# Patient Record
Sex: Male | Born: 1955
Health system: Southern US, Community
[De-identification: ages and names within clinical notes are randomized; demographics above are authoritative.]

## PROBLEM LIST (undated history)

## (undated) DIAGNOSIS — G4730 Sleep apnea, unspecified: Secondary | ICD-10-CM

## (undated) DIAGNOSIS — E039 Hypothyroidism, unspecified: Secondary | ICD-10-CM

## (undated) DIAGNOSIS — R51 Headache: Secondary | ICD-10-CM

## (undated) DIAGNOSIS — E119 Type 2 diabetes mellitus without complications: Secondary | ICD-10-CM

## (undated) DIAGNOSIS — I10 Essential (primary) hypertension: Secondary | ICD-10-CM

## (undated) DIAGNOSIS — R519 Headache, unspecified: Secondary | ICD-10-CM

## (undated) DIAGNOSIS — G8929 Other chronic pain: Secondary | ICD-10-CM

## (undated) DIAGNOSIS — C649 Malignant neoplasm of unspecified kidney, except renal pelvis: Secondary | ICD-10-CM

## (undated) HISTORY — PX: COLONOSCOPY: SHX174

## (undated) HISTORY — DX: Sleep apnea, unspecified: G47.30

## (undated) HISTORY — DX: Malignant neoplasm of unspecified kidney, except renal pelvis: C64.9

## (undated) HISTORY — DX: Essential (primary) hypertension: I10

## (undated) HISTORY — DX: Headache: R51

## (undated) HISTORY — DX: Type 2 diabetes mellitus without complications: E11.9

## (undated) HISTORY — DX: Headache, unspecified: R51.9

## (undated) HISTORY — DX: Other chronic pain: G89.29

## (undated) HISTORY — PX: KNEE SURGERY: SHX244

## (undated) HISTORY — PX: TONSILLECTOMY: SUR1361

## (undated) HISTORY — DX: Hypothyroidism, unspecified: E03.9

---

## 1978-02-15 HISTORY — PX: APPENDECTOMY: SHX54

## 2005-02-09 ENCOUNTER — Ambulatory Visit: Payer: Self-pay | Admitting: Internal Medicine

## 2007-01-27 ENCOUNTER — Ambulatory Visit: Payer: Self-pay | Admitting: Gastroenterology

## 2007-03-01 ENCOUNTER — Ambulatory Visit: Payer: Self-pay | Admitting: Gastroenterology

## 2007-03-22 ENCOUNTER — Ambulatory Visit: Payer: Self-pay | Admitting: Gastroenterology

## 2007-03-22 ENCOUNTER — Encounter: Payer: Self-pay | Admitting: Gastroenterology

## 2007-05-09 ENCOUNTER — Ambulatory Visit: Payer: Self-pay | Admitting: Gastroenterology

## 2007-06-22 ENCOUNTER — Encounter: Payer: Self-pay | Admitting: Gastroenterology

## 2007-06-26 ENCOUNTER — Telehealth: Payer: Self-pay | Admitting: Gastroenterology

## 2008-09-03 ENCOUNTER — Encounter: Payer: Self-pay | Admitting: Gastroenterology

## 2009-02-03 ENCOUNTER — Encounter: Admission: RE | Admit: 2009-02-03 | Discharge: 2009-02-03 | Payer: Self-pay | Admitting: Family Medicine

## 2009-02-17 ENCOUNTER — Encounter: Admission: RE | Admit: 2009-02-17 | Discharge: 2009-02-17 | Payer: Self-pay | Admitting: Family Medicine

## 2009-05-23 ENCOUNTER — Encounter: Admission: RE | Admit: 2009-05-23 | Discharge: 2009-05-23 | Payer: Self-pay | Admitting: Neurology

## 2009-06-04 ENCOUNTER — Encounter: Admission: RE | Admit: 2009-06-04 | Discharge: 2009-06-04 | Payer: Self-pay | Admitting: Neurology

## 2009-06-05 ENCOUNTER — Encounter: Admission: RE | Admit: 2009-06-05 | Discharge: 2009-06-05 | Payer: Self-pay | Admitting: Neurology

## 2010-06-30 NOTE — Letter (Signed)
May 09, 2007    Quita Skye. Artis Flock, M.D.  [address], [address]  [place], [place] [address]   RE:  [name], [name]  MRN:  [id number]  /  DOB:  [birth year]   Dear Rosanne Ashing:   I saw Shawn Mccarty for followup of his endoscopy and colonoscopy, which  were performed on March 22, 2007.   As you recall, he had an endoscopy because of a family history of  Barrett's mucosa in his father and his brother.  The endoscopy did show  some erosive esophagitis, but biopsies showed no evidence of Barrett's  mucosa.  He had been on Aciphex for a month.  He is asymptomatic and the  patient never had typical acid reflux symptoms except when he was  bothered with sleep apnea years ago.  Since being on a CPAP machine, he  has had no symptoms whatsoever.  I suspect he does have some nocturnal  reflux.   His colonoscopy was entirely normal except for a couple of small  hyperplastic rectal nodules.  He has no family history of colon  carcinoma.   Since [name] is asymptomatic, I have asked him to try to discontinue his  Aciphex, but should his symptoms return, he is to call us and we will  start a bedtime dose of PPI therapy.  I have had a  discussion with him about his weight and dietary counseling and an  exercise program.  He says he has had numerous laboratory tests and his  liver function tests are normal.   I will be glad to see him in the future if needed.  I appreciate the  opportunity to participate in the care of this most pleasant patient.    Sincerely,      Vania Rea. Jarold Motto, MD, Caleen Essex, FAGA  Electronically Signed    DRP/MedQ  DD: 05/09/2007  DT: 05/09/2007  Job #: [id number]

## 2011-06-11 ENCOUNTER — Telehealth: Payer: Self-pay | Admitting: *Deleted

## 2013-11-14 ENCOUNTER — Encounter: Payer: Self-pay | Admitting: Gastroenterology

## 2015-10-09 DIAGNOSIS — E119 Type 2 diabetes mellitus without complications: Secondary | ICD-10-CM | POA: Diagnosis not present

## 2015-10-09 DIAGNOSIS — I10 Essential (primary) hypertension: Secondary | ICD-10-CM | POA: Diagnosis not present

## 2015-10-09 DIAGNOSIS — E7800 Pure hypercholesterolemia, unspecified: Secondary | ICD-10-CM | POA: Diagnosis not present

## 2015-10-09 DIAGNOSIS — E039 Hypothyroidism, unspecified: Secondary | ICD-10-CM | POA: Diagnosis not present

## 2015-12-12 DIAGNOSIS — Z205 Contact with and (suspected) exposure to viral hepatitis: Secondary | ICD-10-CM | POA: Diagnosis not present

## 2016-03-31 DIAGNOSIS — E039 Hypothyroidism, unspecified: Secondary | ICD-10-CM | POA: Diagnosis not present

## 2016-03-31 DIAGNOSIS — E7800 Pure hypercholesterolemia, unspecified: Secondary | ICD-10-CM | POA: Diagnosis not present

## 2016-03-31 DIAGNOSIS — I10 Essential (primary) hypertension: Secondary | ICD-10-CM | POA: Diagnosis not present

## 2016-03-31 DIAGNOSIS — E119 Type 2 diabetes mellitus without complications: Secondary | ICD-10-CM | POA: Diagnosis not present

## 2016-10-06 DIAGNOSIS — E7800 Pure hypercholesterolemia, unspecified: Secondary | ICD-10-CM | POA: Diagnosis not present

## 2016-10-06 DIAGNOSIS — E119 Type 2 diabetes mellitus without complications: Secondary | ICD-10-CM | POA: Diagnosis not present

## 2016-10-06 DIAGNOSIS — E039 Hypothyroidism, unspecified: Secondary | ICD-10-CM | POA: Diagnosis not present

## 2016-10-06 DIAGNOSIS — I10 Essential (primary) hypertension: Secondary | ICD-10-CM | POA: Diagnosis not present

## 2016-10-14 DIAGNOSIS — E119 Type 2 diabetes mellitus without complications: Secondary | ICD-10-CM | POA: Diagnosis not present

## 2017-04-13 DIAGNOSIS — I10 Essential (primary) hypertension: Secondary | ICD-10-CM | POA: Diagnosis not present

## 2017-04-13 DIAGNOSIS — E039 Hypothyroidism, unspecified: Secondary | ICD-10-CM | POA: Diagnosis not present

## 2017-04-13 DIAGNOSIS — E7800 Pure hypercholesterolemia, unspecified: Secondary | ICD-10-CM | POA: Diagnosis not present

## 2017-04-13 DIAGNOSIS — E119 Type 2 diabetes mellitus without complications: Secondary | ICD-10-CM | POA: Diagnosis not present

## 2017-04-29 ENCOUNTER — Ambulatory Visit (HOSPITAL_COMMUNITY)
Admission: RE | Admit: 2017-04-29 | Discharge: 2017-04-29 | Disposition: A | Discharge status: Home / Self Care | Payer: BLUE CROSS/BLUE SHIELD | Source: Ambulatory Visit | Attending: Family Medicine | Admitting: Family Medicine

## 2017-04-29 ENCOUNTER — Other Ambulatory Visit: Payer: Self-pay | Admitting: Family Medicine

## 2017-04-29 DIAGNOSIS — N2889 Other specified disorders of kidney and ureter: Secondary | ICD-10-CM | POA: Diagnosis not present

## 2017-04-29 DIAGNOSIS — E278 Other specified disorders of adrenal gland: Secondary | ICD-10-CM | POA: Diagnosis not present

## 2017-04-29 DIAGNOSIS — K760 Fatty (change of) liver, not elsewhere classified: Secondary | ICD-10-CM | POA: Insufficient documentation

## 2017-04-29 DIAGNOSIS — R109 Unspecified abdominal pain: Secondary | ICD-10-CM

## 2017-04-29 DIAGNOSIS — K8020 Calculus of gallbladder without cholecystitis without obstruction: Secondary | ICD-10-CM | POA: Insufficient documentation

## 2017-04-29 LAB — POCT I-STAT CREATININE: CREATININE: 1 mg/dL (ref 0.61–1.24)

## 2017-04-29 MED ORDER — IOPAMIDOL (ISOVUE-300) INJECTION 61%
100.0000 mL | Freq: Once | INTRAVENOUS | Status: AC | PRN
  Administered 2017-04-29: 100 mL via INTRAVENOUS

## 2017-04-29 MED ORDER — IOPAMIDOL (ISOVUE-300) INJECTION 61%
30.0000 mL | Freq: Once | INTRAVENOUS | Status: AC | PRN
  Administered 2017-04-29: 30 mL via ORAL

## 2017-04-29 MED ORDER — IOPAMIDOL (ISOVUE-300) INJECTION 61%
INTRAVENOUS | Status: AC
  Filled 2017-04-29: qty 30

## 2017-04-29 MED ORDER — IOPAMIDOL (ISOVUE-300) INJECTION 61%
INTRAVENOUS | Status: AC
  Filled 2017-04-29: qty 100

## 2017-05-02 ENCOUNTER — Encounter: Payer: Self-pay | Admitting: Gastroenterology

## 2017-05-04 DIAGNOSIS — D49511 Neoplasm of unspecified behavior of right kidney: Secondary | ICD-10-CM | POA: Diagnosis not present

## 2017-05-11 DIAGNOSIS — J984 Other disorders of lung: Secondary | ICD-10-CM | POA: Diagnosis not present

## 2017-05-11 DIAGNOSIS — K8020 Calculus of gallbladder without cholecystitis without obstruction: Secondary | ICD-10-CM | POA: Diagnosis not present

## 2017-05-11 DIAGNOSIS — D3502 Benign neoplasm of left adrenal gland: Secondary | ICD-10-CM | POA: Diagnosis not present

## 2017-05-11 DIAGNOSIS — D49511 Neoplasm of unspecified behavior of right kidney: Secondary | ICD-10-CM | POA: Diagnosis not present

## 2017-05-12 ENCOUNTER — Other Ambulatory Visit: Payer: Self-pay | Admitting: Urology

## 2017-05-12 ENCOUNTER — Ambulatory Visit: Payer: BLUE CROSS/BLUE SHIELD | Admitting: Gastroenterology

## 2017-05-12 ENCOUNTER — Encounter (INDEPENDENT_AMBULATORY_CARE_PROVIDER_SITE_OTHER): Payer: Self-pay

## 2017-05-12 ENCOUNTER — Encounter: Payer: Self-pay | Admitting: Gastroenterology

## 2017-05-12 VITALS — BP 106/64 | HR 88 | Ht 67.0 in | Wt 236.1 lb

## 2017-05-12 DIAGNOSIS — R109 Unspecified abdominal pain: Secondary | ICD-10-CM

## 2017-05-12 DIAGNOSIS — Z1211 Encounter for screening for malignant neoplasm of colon: Secondary | ICD-10-CM | POA: Diagnosis not present

## 2017-05-12 NOTE — Patient Instructions (Addendum)
  Normal BMI (Body Mass Index- based on height and weight) is between 19 and 25. Your BMI today is Body mass index is 36.98 kg/m. Marland Kitchen Please consider follow up  regarding your BMI with your Primary Care Provider.  You have been scheduled for a colonoscopy. Please follow written instructions given to you at your visit today.  Please pick up your prep supplies at the pharmacy within the next 1-3 days. If you use inhalers (even only as needed), please bring them with you on the day of your procedure. Your physician has requested that you go to www.[email or web address].com and enter the access code given to you at your visit today. This web site gives a general overview about your procedure. However, you should still follow specific instructions given to you by our office regarding your preparation for the procedure.  Consider  Miralax  Once or twice daily.

## 2017-05-12 NOTE — Progress Notes (Addendum)
05/12/2017 Shawn Mccarty [id number] Feb 06, 1956   HISTORY OF PRESENT ILLNESS: This is a pleasant 62 year old male who is new to our office.  He was referred here by his PCP, Dr. Marily Memos, for evaluation regarding left flank/back pain.  The patient tells me that about 1 month ago he was walking up a flight of stairs at work and all of a sudden developed pain on his left flank.  It has continued since that time.  It reaches about a 6 out of 10 intensity on the pain scale.  Then several days later he noticed in that same area when he was taking off his clothes and looking in the mirror that he had a large bulge in that area.  That is when he seeked medical attention.  He underwent CT scan of the abdomen and pelvis with contrast on March 15.  That study did not show any cause of his symptoms, but did show an enhancing mass lesion on the right kidney consistent with renal cell carcinoma.  He is following with urology for that and actually had another CT scan yesterday.  They are planning for surgery in early May.  They think they can likely do a partial nephrectomy.  He is also found to have fatty liver and cholelithiasis without complicating factors.  He continues to have pain in that area which is worse with sitting and putting pressure on the area.  He says that if he pushes in a certain area on his back that seems to feel better.  He has tried different muscle relaxers, NSAIDs sporadically, heating pad, etc.  Due to these certain medications he has had some mild constipation and some very light red colored blood on the toilet paper with the straining.  He has tried MiraLAX sporadically, but not using it regularly.  He had a colonoscopy here in January 2009 by Dr. Sharlett Iles at which time the study was normal.   Past Medical History:  Diagnosis Date  . Chronic headaches   . DM (diabetes mellitus) ([place])   . HTN (hypertension)   . Hypothyroidism   . Renal cell carcinoma ([place])   . Sleep apnea with use  of continuous positive airway pressure (CPAP)    Past Surgical History:  Procedure Laterality Date  . APPENDECTOMY  1980  . KNEE SURGERY Left   . TONSILLECTOMY      reports that he quit smoking about 14 years ago. He quit smokeless tobacco use about 29 years ago. His smokeless tobacco use included chew. He reports that he drinks alcohol. He reports that he does not use drugs. family history includes Diabetes in his maternal grandfather; Esophageal cancer (age of onset: 43) in his father; GER disease in his brother; Heart attack in his mother; Heart disease in his maternal grandfather; Pancreatic cancer in his paternal grandmother; Prostate cancer in his maternal grandfather. Allergies  Allergen Reactions  . Other     Combination of antibiotics and steroids Causes weakness in tendons and muscles      Outpatient Encounter Medications as of 05/12/2017  Medication Sig  . levothyroxine (SYNTHROID, LEVOTHROID) 75 MCG tablet Take 112 mcg by mouth daily before breakfast.  . lisinopril (PRINIVIL,ZESTRIL) 5 MG tablet Take 5 mg by mouth daily.  . metFORMIN (GLUCOPHAGE-XR) 500 MG 24 hr tablet Take 1,000 mg by mouth 2 (two) times daily.  . Multiple Vitamin (ONE-A-DAY MENS PO) Take 1 tablet by mouth daily.   No facility-administered encounter medications on file as of 05/12/2017.  REVIEW OF SYSTEMS  : All other systems reviewed and negative except where noted in the History of Present Illness.   PHYSICAL EXAM: BP 106/64 (BP Location: Left Arm, Patient Position: Sitting, Cuff Size: Normal)   Pulse 88   Ht 5\' 7"  (1.702 m) Comment: height measured without shoes  Wt 236 lb 2 oz (107.1 kg)   BMI 36.98 kg/m  General: Well developed white male in no acute distress Head: Normocephalic and atraumatic Eyes:  Sclerae anicteric, conjunctiva pink. Ears: Normal auditory acuity Lungs: Clear throughout to auscultation; no increased WOB. Heart: Regular rate and rhythm; no M/R/G. Abdomen: Soft, obese,  non-distended.  BS present.  Non-tender.  Abdominal wall bulge noted on the left flank. Rectal:  Will be done at the time of colonoscopy. Musculoskeletal: Symmetrical with no gross deformities  Skin: No lesions on visible extremities Extremities: No edema  Neurological: Alert oriented x 4, grossly non-focal Psychological:  Alert and cooperative. Normal mood and affect  ASSESSMENT AND PLAN: *Left flank/back pain:  Pain is positional/worse with movement.  Has a bulge on the left flank area.  Two CT scans negative for cause.  I think that this is likely musculoskeletal.   *Constipation:  Mild since taking some different medications for the flank pain.  Will start Miralax daily. *Screening colonoscopy:  Will schedule with Dr. Carlean Purl.  **The risks, benefits, and alternatives to colonoscopy were discussed with the patient and he consents to proceed.   CC:  Waldemar Dickens, MD  Agree with Ms. Alphia Kava management.  Gatha Mayer, MD, [facility]

## 2017-05-17 ENCOUNTER — Encounter: Payer: Self-pay | Admitting: Internal Medicine

## 2017-05-27 ENCOUNTER — Encounter: Payer: Self-pay | Admitting: Internal Medicine

## 2017-05-27 ENCOUNTER — Ambulatory Visit (AMBULATORY_SURGERY_CENTER): Payer: BLUE CROSS/BLUE SHIELD | Admitting: Internal Medicine

## 2017-05-27 ENCOUNTER — Other Ambulatory Visit: Payer: Self-pay

## 2017-05-27 VITALS — BP 106/68 | HR 78 | Temp 98.4°F | Resp 13 | Ht 67.0 in | Wt 236.0 lb

## 2017-05-27 DIAGNOSIS — K635 Polyp of colon: Secondary | ICD-10-CM

## 2017-05-27 DIAGNOSIS — Z1211 Encounter for screening for malignant neoplasm of colon: Secondary | ICD-10-CM

## 2017-05-27 DIAGNOSIS — D120 Benign neoplasm of cecum: Secondary | ICD-10-CM

## 2017-05-27 MED ORDER — SODIUM CHLORIDE 0.9 % IV SOLN: 500.0000 mL | Freq: Once | INTRAVENOUS | Status: DC

## 2017-05-27 NOTE — Patient Instructions (Addendum)
I found and removed one tiny polyp - not cancer. I will let you know pathology results and when to have another routine colonoscopy by mail and/or My Chart.  You also have a condition called diverticulosis - common and not usually a problem. Please read the handout provided.  I felt the nodule or bulge you have and printed a picture from the CT that may show it.   I appreciate the opportunity to care for you. Gatha Mayer, MD, FACG  YOU HAD AN ENDOSCOPIC PROCEDURE TODAY AT [facility]:   Refer to the procedure report that was given to you for any specific questions about what was found during the examination.  If the procedure report does not answer your questions, please call your gastroenterologist to clarify.  If you requested that your care partner not be given the details of your procedure findings, then the procedure report has been included in a sealed envelope for you to review at your convenience later.  YOU SHOULD EXPECT: Some feelings of bloating in the abdomen. Passage of more gas than usual.  Walking can help get rid of the air that was put into your GI tract during the procedure and reduce the bloating. If you had a lower endoscopy (such as a colonoscopy or flexible sigmoidoscopy) you may notice spotting of blood in your stool or on the toilet paper. If you underwent a bowel prep for your procedure, you may not have a normal bowel movement for a few days.  Please Note:  You might notice some irritation and congestion in your nose or some drainage.  This is from the oxygen used during your procedure.  There is no need for concern and it should clear up in a day or so.  SYMPTOMS TO REPORT IMMEDIATELY:   Following lower endoscopy (colonoscopy or flexible sigmoidoscopy):  Excessive amounts of blood in the stool  Significant tenderness or worsening of abdominal pains  Swelling of the abdomen that is new, acute  Fever of 100F or higher For urgent or  emergent issues, a gastroenterologist can be reached at any hour by calling [phone].   DIET:  We do recommend a small meal at first, but then you may proceed to your regular diet.  Drink plenty of fluids but you should avoid alcoholic beverages for 24 hours.  ACTIVITY:  You should plan to take it easy for the rest of today and you should NOT DRIVE or use heavy machinery until tomorrow (because of the sedation medicines used during the test).    FOLLOW UP: Our staff will call the number listed on your records the next business day following your procedure to check on you and address any questions or concerns that you may have regarding the information given to you following your procedure. If we do not reach you, we will leave a message.  However, if you are feeling well and you are not experiencing any problems, there is no need to return our call.  We will assume that you have returned to your regular daily activities without incident.  If any biopsies were taken you will be contacted by phone or by letter within the next 1-3 weeks.  Please call us at [phone] if you have not heard about the biopsies in 3 weeks.    SIGNATURES/CONFIDENTIALITY: You and/or your care partner have signed paperwork which will be entered into your electronic medical record.  These signatures attest to the fact that that the information  above on your After Visit Summary has been reviewed and is understood.  Full responsibility of the confidentiality of this discharge information lies with you and/or your care-partner. 

## 2017-05-27 NOTE — Progress Notes (Signed)
Called to room to assist during endoscopic procedure.  Patient ID and intended procedure confirmed with present staff. Received instructions for my participation in the procedure from the performing physician.  

## 2017-05-27 NOTE — Op Note (Signed)
[facility] Patient Name: Shawn Mccarty Procedure Date: 05/27/2017 2:05 PM MRN: [id number] Endoscopist: Gatha Mayer , MD Age: 62 Referring MD:  Date of Birth: [birth year] Gender: Male Account #: [id number] Procedure:                Colonoscopy Indications:              Screening for colorectal malignant neoplasm, Last                            colonoscopy: 2009 Medicines:                Propofol per Anesthesia, Monitored Anesthesia Care Procedure:                Pre-Anesthesia Assessment:                           - Prior to the procedure, a History and Physical                            was performed, and patient medications and                            allergies were reviewed. The patient's tolerance of                            previous anesthesia was also reviewed. The risks                            and benefits of the procedure and the sedation                            options and risks were discussed with the patient.                            All questions were answered, and informed consent                            was obtained. Prior Anticoagulants: The patient has                            taken no previous anticoagulant or antiplatelet                            agents. ASA Grade Assessment: III - A patient with                            severe systemic disease. After reviewing the risks                            and benefits, the patient was deemed in                            satisfactory condition to undergo the procedure.  After obtaining informed consent, the colonoscope                            was passed under direct vision. Throughout the                            procedure, the patient's blood pressure, pulse, and                            oxygen saturations were monitored continuously. The                            Model CF-HQ190L [id number]) scope was introduced                            through the anus  and advanced to the the cecum,                            identified by appendiceal orifice and ileocecal                            valve. The colonoscopy was performed without                            difficulty. The patient tolerated the procedure                            well. The quality of the bowel preparation was                            good. The ileocecal valve, appendiceal orifice, and                            rectum were photographed. The bowel preparation                            used was Miralax. Scope In: 2:14:23 PM Scope Out: 2:26:43 PM Scope Withdrawal Time: 0 hours 10 minutes 50 seconds  Total Procedure Duration: 0 hours 12 minutes 20 seconds  Findings:                 The perianal and digital rectal examinations were                            normal. Pertinent negatives include normal prostate                            (size, shape, and consistency).                           A 2 mm polyp was found in the cecum. The polyp was                            sessile. The polyp was removed with a cold biopsy  forceps. Resection and retrieval were complete.                            Verification of patient identification for the                            specimen was done. Estimated blood loss was minimal.                           A few large-mouthed diverticula were found in the                            sigmoid colon.                           The exam was otherwise without abnormality on                            direct and retroflexion views. Complications:            No immediate complications. Estimated Blood Loss:     Estimated blood loss was minimal. Impression:               - One 2 mm polyp in the cecum, removed with a cold                            biopsy forceps. Resected and retrieved.                           - Diverticulosis in the sigmoid colon.                           - The examination was otherwise normal on  direct                            and retroflexion views. Recommendation:           - Patient has a contact number available for                            emergencies. The signs and symptoms of potential                            delayed complications were discussed with the                            patient. Return to normal activities tomorrow.                            Written discharge instructions were provided to the                            patient.                           - Resume previous diet.                           -  Continue present medications.                           - Repeat colonoscopy is recommended. The                            colonoscopy date will be determined after pathology                            results from today's exam become available for                            review. Gatha Mayer, MD 05/27/2017 2:36:02 PM This report has been signed electronically.

## 2017-05-27 NOTE — Progress Notes (Signed)
A and O x3. Report to RN. Tolerated MAC anesthesia well.

## 2017-05-30 ENCOUNTER — Telehealth: Payer: Self-pay | Admitting: *Deleted

## 2017-05-30 NOTE — Telephone Encounter (Signed)
  Follow up Call-  Call back number 05/27/2017  Post procedure Call Back phone  # [phone]  Permission to leave phone message Yes  Some recent data might be hidden     Patient questions:  Do you have a fever, pain , or abdominal swelling? No. Pain Score  0 *  Have you tolerated food without any problems? Yes.    Have you been able to return to your normal activities? Yes.    Do you have any questions about your discharge instructions: Diet   No. Medications  No. Follow up visit  No.  Do you have questions or concerns about your Care? No.  Actions: * If pain score is 4 or above: No action needed, pain <4.

## 2017-06-05 ENCOUNTER — Encounter: Payer: Self-pay | Admitting: Internal Medicine

## 2017-06-05 NOTE — Progress Notes (Signed)
Polyp not pre-cancerous Recall 2029 My Chart

## 2017-06-13 NOTE — Patient Instructions (Signed)
Shawn Mccarty  06/13/2017   Your procedure is scheduled on:  Monday 06/20/2017  Report to [facility] Main  Entrance              Report to admitting at  [place] AM    Call this number if you have problems the morning of surgery [phone]               PLEASE BRING YOUR CPAP MASK AND TUBING WITH YOU TO [facility]!              Day before surgery, drink 1-8 ounce bottle Magnesium Citrate by noon with a clear liquid diet all day up until midnight.             The night before surgery, use 1-Fleet's enema      CLEAR LIQUID DIET   Foods Allowed                                                                     Foods Excluded  Coffee and tea, regular and decaf                             liquids that you cannot  Plain Jell-O in any flavor                                             see through such as: Fruit ices (not with fruit pulp)                                     milk, soups, orange juice  Iced Popsicles                                    All solid food Carbonated beverages, regular and diet                                    Cranberry, grape and apple juices Sports drinks like Gatorade Lightly seasoned clear broth or consume(fat free) Sugar, honey syrup  Sample Menu Breakfast                                Lunch                                     Supper Cranberry juice                    Beef broth                            Chicken  broth Jell-O                                     Grape juice                           Apple juice Coffee or tea                        Jell-O                                      Popsicle                                                Coffee or tea                        Coffee or tea  _____________________________________________________________________  How to Manage Your Diabetes Before and After Surgery  Why is it important to control my blood sugar before and after surgery? . Improving  blood sugar levels before and after surgery helps healing and can limit problems. . A way of improving blood sugar control is eating a healthy diet by: o  Eating less sugar and carbohydrates o  Increasing activity/exercise o  Talking with your doctor about reaching your blood sugar goals . High blood sugars (greater than 180 mg/dL) can raise your risk of infections and slow your recovery, so you will need to focus on controlling your diabetes during the weeks before surgery. . Make sure that the doctor who takes care of your diabetes knows about your planned surgery including the date and location.  How do I manage my blood sugar before surgery? . Check your blood sugar at least 4 times a day, starting 2 days before surgery, to make sure that the level is not too high or low. o Check your blood sugar the morning of your surgery when you wake up and every 2 hours until you get to the Short Stay unit. . If your blood sugar is less than 70 mg/dL, you will need to treat for low blood sugar: o Do not take insulin. o Treat a low blood sugar (less than 70 mg/dL) with  cup of clear juice (cranberry or apple), 4 glucose tablets, OR glucose gel. o Recheck blood sugar in 15 minutes after treatment (to make sure it is greater than 70 mg/dL). If your blood sugar is not greater than 70 mg/dL on recheck, call [phone] for further instructions. . Report your blood sugar to the short stay nurse when you get to Short Stay.  . If you are admitted to the hospital after surgery: o Your blood sugar will be checked by the staff and you will probably be given insulin after surgery (instead of oral diabetes medicines) to make sure you have good blood sugar levels. o The goal for blood sugar control after surgery is 80-180 mg/dL.   WHAT DO I DO ABOUT MY DIABETES MEDICATION?       The day before surgery, take your Metformin as usual.  . Do not take oral diabetes medicines (pills) the morning of  surgery.       Remember: Do not eat food or drink liquids :After Midnight.     Take these medicines the morning of surgery with A SIP OF WATER: Levothyroxine (Synthroid)   DO NOT TAKE ANY DIABETIC MEDICATIONS DAY OF YOUR SURGERY                               You may not have any metal on your body including hair pins and              piercings  Do not wear jewelry, make-up, lotions, powders or perfumes, deodorant             Do not wear nail polish.  Do not shave  48 hours prior to surgery.              Men may shave face and neck.   Do not bring valuables to the hospital. [place].  Contacts, dentures or bridgework may not be worn into surgery.  Leave suitcase in the car. After surgery it may be brought to your room.                  Please read over the following fact sheets you were given: _____________________________________________________________________             [facility] - Preparing for Surgery Before surgery, you can play an important role.  Because skin is not sterile, your skin needs to be as free of germs as possible.  You can reduce the number of germs on your skin by washing with CHG (chlorahexidine gluconate) soap before surgery.  CHG is an antiseptic cleaner which kills germs and bonds with the skin to continue killing germs even after washing. Please DO NOT use if you have an allergy to CHG or antibacterial soaps.  If your skin becomes reddened/irritated stop using the CHG and inform your nurse when you arrive at Short Stay. Do not shave (including legs and underarms) for at least 48 hours prior to the first CHG shower.  You may shave your face/neck. Please follow these instructions carefully:  1.  Shower with CHG Soap the night before surgery and the  morning of Surgery.  2.  If you choose to wash your hair, wash your hair first as usual with your  normal  shampoo.  3.  After you shampoo, rinse your hair and body  thoroughly to remove the  shampoo.                           4.  Use CHG as you would any other liquid soap.  You can apply chg directly  to the skin and wash                       Gently with a scrungie or clean washcloth.  5.  Apply the CHG Soap to your body ONLY FROM THE NECK DOWN.   Do not use on face/ open                           Wound or open sores. Avoid contact with eyes, ears mouth and genitals (private parts).  Wash face,  Genitals (private parts) with your normal soap.             6.  Wash thoroughly, paying special attention to the area where your surgery  will be performed.  7.  Thoroughly rinse your body with warm water from the neck down.  8.  DO NOT shower/wash with your normal soap after using and rinsing off  the CHG Soap.                9.  Pat yourself dry with a clean towel.            10.  Wear clean pajamas.            11.  Place clean sheets on your bed the night of your first shower and do not  sleep with pets. Day of Surgery : Do not apply any lotions/deodorants the morning of surgery.  Please wear clean clothes to the hospital/surgery center.  FAILURE TO FOLLOW THESE INSTRUCTIONS MAY RESULT IN THE CANCELLATION OF YOUR SURGERY PATIENT SIGNATURE_________________________________  NURSE SIGNATURE__________________________________  ________________________________________________________________________   Adam Phenix  An incentive spirometer is a tool that can help keep your lungs clear and active. This tool measures how well you are filling your lungs with each breath. Taking long deep breaths may help reverse or decrease the chance of developing breathing (pulmonary) problems (especially infection) following:  A long period of time when you are unable to move or be active. BEFORE THE PROCEDURE   If the spirometer includes an indicator to show your best effort, your nurse or respiratory therapist will set it to a desired goal.  If  possible, sit up straight or lean slightly forward. Try not to slouch.  Hold the incentive spirometer in an upright position. INSTRUCTIONS FOR USE  1. Sit on the edge of your bed if possible, or sit up as far as you can in bed or on a chair. 2. Hold the incentive spirometer in an upright position. 3. Breathe out normally. 4. Place the mouthpiece in your mouth and seal your lips tightly around it. 5. Breathe in slowly and as deeply as possible, raising the piston or the ball toward the top of the column. 6. Hold your breath for 3-5 seconds or for as long as possible. Allow the piston or ball to fall to the bottom of the column. 7. Remove the mouthpiece from your mouth and breathe out normally. 8. Rest for a few seconds and repeat Steps 1 through 7 at least 10 times every 1-2 hours when you are awake. Take your time and take a few normal breaths between deep breaths. 9. The spirometer may include an indicator to show your best effort. Use the indicator as a goal to work toward during each repetition. 10. After each set of 10 deep breaths, practice coughing to be sure your lungs are clear. If you have an incision (the cut made at the time of surgery), support your incision when coughing by placing a pillow or rolled up towels firmly against it. Once you are able to get out of bed, walk around indoors and cough well. You may stop using the incentive spirometer when instructed by your caregiver.  RISKS AND COMPLICATIONS  Take your time so you do not get dizzy or light-headed.  If you are in pain, you may need to take or ask for pain medication before doing incentive spirometry. It is harder to take a deep breath if you are having pain.  AFTER USE  Rest and breathe slowly and easily.  It can be helpful to keep track of a log of your progress. Your caregiver can provide you with a simple table to help with this. If you are using the spirometer at home, follow these instructions: [place]  IF:   You are having difficultly using the spirometer.  You have trouble using the spirometer as often as instructed.  Your pain medication is not giving enough relief while using the spirometer.  You develop fever of 100.5 F (38.1 C) or higher. SEEK IMMEDIATE MEDICAL CARE IF:   You cough up bloody sputum that had not been present before.  You develop fever of 102 F (38.9 C) or greater.  You develop worsening pain at or near the incision site. MAKE SURE YOU:   Understand these instructions.  Will watch your condition.  Will get help right away if you are not doing well or get worse. Document Released: 06/14/2006 Document Revised: 04/26/2011 Document Reviewed: 08/15/2006 Exit[employer] Patient Information 2014 Exit[employer], [place].   ________________________________________________________________________  WHAT IS A BLOOD TRANSFUSION? Blood Transfusion Information  A transfusion is the replacement of blood or some of its parts. Blood is made up of multiple cells which provide different functions.  Red blood cells carry oxygen and are used for blood loss replacement.  White blood cells fight against infection.  Platelets control bleeding.  Plasma helps clot blood.  Other blood products are available for specialized needs, such as hemophilia or other clotting disorders. BEFORE THE TRANSFUSION  Who gives blood for transfusions?   Healthy volunteers who are fully evaluated to make sure their blood is safe. This is blood bank blood. Transfusion therapy is the safest it has ever been in the practice of medicine. Before blood is taken from a donor, a complete history is taken to make sure that person has no history of diseases nor engages in risky social behavior (examples are intravenous drug use or sexual activity with multiple partners). The donor's travel history is screened to minimize risk of transmitting infections, such as malaria. The donated blood is tested for signs of  infectious diseases, such as HIV and hepatitis. The blood is then tested to be sure it is compatible with you in order to minimize the chance of a transfusion reaction. If you or a relative donates blood, this is often done in anticipation of surgery and is not appropriate for emergency situations. It takes many days to process the donated blood. RISKS AND COMPLICATIONS Although transfusion therapy is very safe and saves many lives, the main dangers of transfusion include:   Getting an infectious disease.  Developing a transfusion reaction. This is an allergic reaction to something in the blood you were given. Every precaution is taken to prevent this. The decision to have a blood transfusion has been considered carefully by your caregiver before blood is given. Blood is not given unless the benefits outweigh the risks. AFTER THE TRANSFUSION  Right after receiving a blood transfusion, you will usually feel much better and more energetic. This is especially true if your red blood cells have gotten low (anemic). The transfusion raises the level of the red blood cells which carry oxygen, and this usually causes an energy increase.  The nurse administering the transfusion will monitor you carefully for complications. HOME CARE INSTRUCTIONS  No special instructions are needed after a transfusion. You may find your energy is better. Speak with your caregiver about any limitations on activity for underlying diseases  you may have. SEEK MEDICAL CARE IF:   Your condition is not improving after your transfusion.  You develop redness or irritation at the intravenous (IV) site. SEEK IMMEDIATE MEDICAL CARE IF:  Any of the following symptoms occur over the next 12 hours:  Shaking chills.  You have a temperature by mouth above 102 F (38.9 C), not controlled by medicine.  Chest, back, or muscle pain.  People around you feel you are not acting correctly or are confused.  Shortness of breath or  difficulty breathing.  Dizziness and fainting.  You get a rash or develop hives.  You have a decrease in urine output.  Your urine turns a dark color or changes to pink, red, or brown. Any of the following symptoms occur over the next 10 days:  You have a temperature by mouth above 102 F (38.9 C), not controlled by medicine.  Shortness of breath.  Weakness after normal activity.  The white part of the eye turns yellow (jaundice).  You have a decrease in the amount of urine or are urinating less often.  Your urine turns a dark color or changes to pink, red, or brown. Document Released: 01/30/2000 Document Revised: 04/26/2011 Document Reviewed: 09/18/2007 [facility] Patient Information 2014 [place], [place].  _______________________________________________________________________

## 2017-06-14 ENCOUNTER — Encounter (HOSPITAL_COMMUNITY): Payer: Self-pay

## 2017-06-14 ENCOUNTER — Other Ambulatory Visit: Payer: Self-pay

## 2017-06-14 ENCOUNTER — Encounter (HOSPITAL_COMMUNITY)
Admission: RE | Admit: 2017-06-14 | Discharge: 2017-06-14 | Disposition: A | Discharge status: Home / Self Care | Payer: BLUE CROSS/BLUE SHIELD | Source: Ambulatory Visit | Attending: Urology | Admitting: Urology

## 2017-06-14 DIAGNOSIS — I10 Essential (primary) hypertension: Secondary | ICD-10-CM | POA: Diagnosis not present

## 2017-06-14 DIAGNOSIS — D49511 Neoplasm of unspecified behavior of right kidney: Secondary | ICD-10-CM | POA: Diagnosis not present

## 2017-06-14 DIAGNOSIS — Z01818 Encounter for other preprocedural examination: Secondary | ICD-10-CM | POA: Diagnosis not present

## 2017-06-14 LAB — BASIC METABOLIC PANEL
ANION GAP: 9 (ref 5–15)
BUN: 20 mg/dL (ref 6–20)
CALCIUM: 9.3 mg/dL (ref 8.9–10.3)
CO2: 24 mmol/L (ref 22–32)
CREATININE: 1.27 mg/dL — AB (ref 0.61–1.24)
Chloride: 109 mmol/L (ref 101–111)
GFR, EST NON AFRICAN AMERICAN: 59 mL/min — AB (ref >60)
GLUCOSE: 108 mg/dL — AB (ref 65–99)
Potassium: 4.1 mmol/L (ref 3.5–5.1)
Sodium: 142 mmol/L (ref 135–145)

## 2017-06-14 LAB — CBC
HCT: 42.2 % (ref 39.0–52.0)
HEMOGLOBIN: 14.2 g/dL (ref 13.0–17.0)
MCH: 27 pg (ref 26.0–34.0)
MCHC: 33.6 g/dL (ref 30.0–36.0)
MCV: 80.2 fL (ref 78.0–100.0)
PLATELETS: 220 10*3/uL (ref 150–400)
RBC: 5.26 MIL/uL (ref 4.22–5.81)
RDW: 14.2 % (ref 11.5–15.5)
WBC: 7.5 10*3/uL (ref 4.0–10.5)

## 2017-06-14 LAB — HEMOGLOBIN A1C
HEMOGLOBIN A1C: 5.8 % — AB (ref 4.8–5.6)
MEAN PLASMA GLUCOSE: 119.76 mg/dL

## 2017-06-14 LAB — GLUCOSE, CAPILLARY: GLUCOSE-CAPILLARY: 112 mg/dL — AB (ref 65–99)

## 2017-06-15 LAB — ABO/RH: ABO/RH(D): A POS

## 2017-06-15 NOTE — Pre-Procedure Instructions (Signed)
Hgb A1C results 06/14/2017 faxed to Dr. Alinda Money via epic.

## 2017-06-20 ENCOUNTER — Observation Stay (HOSPITAL_COMMUNITY)
Admission: RE | Admit: 2017-06-20 | Discharge: 2017-06-22 | Disposition: A | Discharge status: Home / Self Care | Payer: BLUE CROSS/BLUE SHIELD | Source: Ambulatory Visit | Attending: Urology | Admitting: Urology

## 2017-06-20 ENCOUNTER — Ambulatory Visit (HOSPITAL_COMMUNITY): Payer: BLUE CROSS/BLUE SHIELD | Admitting: Anesthesiology

## 2017-06-20 ENCOUNTER — Encounter (HOSPITAL_COMMUNITY)
Admission: RE | Disposition: A | Discharge status: Home / Self Care | Payer: Self-pay | Source: Ambulatory Visit | Attending: Urology

## 2017-06-20 ENCOUNTER — Other Ambulatory Visit: Payer: Self-pay

## 2017-06-20 ENCOUNTER — Encounter (HOSPITAL_COMMUNITY): Payer: Self-pay | Admitting: Emergency Medicine

## 2017-06-20 DIAGNOSIS — Z7984 Long term (current) use of oral hypoglycemic drugs: Secondary | ICD-10-CM | POA: Diagnosis not present

## 2017-06-20 DIAGNOSIS — G4730 Sleep apnea, unspecified: Secondary | ICD-10-CM | POA: Insufficient documentation

## 2017-06-20 DIAGNOSIS — E119 Type 2 diabetes mellitus without complications: Secondary | ICD-10-CM | POA: Insufficient documentation

## 2017-06-20 DIAGNOSIS — Z791 Long term (current) use of non-steroidal anti-inflammatories (NSAID): Secondary | ICD-10-CM | POA: Diagnosis not present

## 2017-06-20 DIAGNOSIS — Z79899 Other long term (current) drug therapy: Secondary | ICD-10-CM | POA: Insufficient documentation

## 2017-06-20 DIAGNOSIS — Z6836 Body mass index (BMI) 36.0-36.9, adult: Secondary | ICD-10-CM | POA: Diagnosis not present

## 2017-06-20 DIAGNOSIS — Z79890 Hormone replacement therapy (postmenopausal): Secondary | ICD-10-CM | POA: Insufficient documentation

## 2017-06-20 DIAGNOSIS — D49519 Neoplasm of unspecified behavior of unspecified kidney: Secondary | ICD-10-CM | POA: Diagnosis present

## 2017-06-20 DIAGNOSIS — E669 Obesity, unspecified: Secondary | ICD-10-CM | POA: Diagnosis not present

## 2017-06-20 DIAGNOSIS — I10 Essential (primary) hypertension: Secondary | ICD-10-CM | POA: Diagnosis not present

## 2017-06-20 DIAGNOSIS — Z23 Encounter for immunization: Secondary | ICD-10-CM | POA: Insufficient documentation

## 2017-06-20 DIAGNOSIS — M1990 Unspecified osteoarthritis, unspecified site: Secondary | ICD-10-CM | POA: Diagnosis not present

## 2017-06-20 DIAGNOSIS — E039 Hypothyroidism, unspecified: Secondary | ICD-10-CM | POA: Insufficient documentation

## 2017-06-20 DIAGNOSIS — Z9989 Dependence on other enabling machines and devices: Secondary | ICD-10-CM | POA: Diagnosis not present

## 2017-06-20 DIAGNOSIS — C641 Malignant neoplasm of right kidney, except renal pelvis: Secondary | ICD-10-CM | POA: Diagnosis not present

## 2017-06-20 DIAGNOSIS — Z87891 Personal history of nicotine dependence: Secondary | ICD-10-CM | POA: Insufficient documentation

## 2017-06-20 DIAGNOSIS — D49511 Neoplasm of unspecified behavior of right kidney: Secondary | ICD-10-CM | POA: Diagnosis not present

## 2017-06-20 HISTORY — PX: ROBOT ASSISTED LAPAROSCOPIC NEPHRECTOMY: SHX5140

## 2017-06-20 LAB — BASIC METABOLIC PANEL
Anion gap: 9 (ref 5–15)
BUN: 12 mg/dL (ref 6–20)
CHLORIDE: 106 mmol/L (ref 101–111)
CO2: 26 mmol/L (ref 22–32)
Calcium: 8.9 mg/dL (ref 8.9–10.3)
Creatinine, Ser: 1.28 mg/dL — ABNORMAL HIGH (ref 0.61–1.24)
GFR calc Af Amer: >60 mL/min (ref >60)
GFR calc non Af Amer: 59 mL/min — ABNORMAL LOW (ref >60)
Glucose, Bld: 167 mg/dL — ABNORMAL HIGH (ref 65–99)
Potassium: 4.3 mmol/L (ref 3.5–5.1)
SODIUM: 141 mmol/L (ref 135–145)

## 2017-06-20 LAB — TYPE AND SCREEN
ABO/RH(D): A POS
Antibody Screen: NEGATIVE

## 2017-06-20 LAB — HEMOGLOBIN AND HEMATOCRIT, BLOOD
HCT: 41.2 % (ref 39.0–52.0)
HEMOGLOBIN: 13.9 g/dL (ref 13.0–17.0)

## 2017-06-20 LAB — GLUCOSE, CAPILLARY
GLUCOSE-CAPILLARY: 139 mg/dL — AB (ref 65–99)
Glucose-Capillary: 127 mg/dL — ABNORMAL HIGH (ref 65–99)
Glucose-Capillary: 172 mg/dL — ABNORMAL HIGH (ref 65–99)

## 2017-06-20 SURGERY — NEPHRECTOMY, RADICAL, ROBOT-ASSISTED, LAPAROSCOPIC, ADULT
Anesthesia: General | Laterality: Right

## 2017-06-20 MED ORDER — SUGAMMADEX SODIUM 500 MG/5ML IV SOLN
INTRAVENOUS | Status: AC
  Filled 2017-06-20: qty 5

## 2017-06-20 MED ORDER — CEFAZOLIN SODIUM-DEXTROSE 1-4 GM/50ML-% IV SOLN
1.0000 g | Freq: Three times a day (TID) | INTRAVENOUS | Status: AC
  Administered 2017-06-20 – 2017-06-21 (×2): 1 g via INTRAVENOUS
  Filled 2017-06-20 (×2): qty 50

## 2017-06-20 MED ORDER — SCOPOLAMINE 1 MG/3DAYS TD PT72
1.0000 | MEDICATED_PATCH | Freq: Once | TRANSDERMAL | Status: AC
  Administered 2017-06-20: 1 via TRANSDERMAL
  Filled 2017-06-20: qty 1

## 2017-06-20 MED ORDER — SODIUM CHLORIDE 0.9 % IJ SOLN
INTRAMUSCULAR | Status: DC | PRN
  Administered 2017-06-20: 20 mL

## 2017-06-20 MED ORDER — ROCURONIUM BROMIDE 100 MG/10ML IV SOLN
INTRAVENOUS | Status: DC | PRN
  Administered 2017-06-20: 60 mg via INTRAVENOUS
  Administered 2017-06-20 (×2): 10 mg via INTRAVENOUS
  Administered 2017-06-20: 20 mg via INTRAVENOUS
  Administered 2017-06-20 (×3): 10 mg via INTRAVENOUS

## 2017-06-20 MED ORDER — LACTATED RINGERS IR SOLN
Status: DC | PRN
  Administered 2017-06-20: 1000 mL

## 2017-06-20 MED ORDER — LIDOCAINE 2% (20 MG/ML) 5 ML SYRINGE
INTRAMUSCULAR | Status: AC
  Filled 2017-06-20: qty 5

## 2017-06-20 MED ORDER — ONDANSETRON HCL 4 MG/2ML IJ SOLN
INTRAMUSCULAR | Status: DC | PRN
  Administered 2017-06-20: 4 mg via INTRAVENOUS

## 2017-06-20 MED ORDER — HEMOSTATIC AGENTS (NO CHARGE) OPTIME
TOPICAL | Status: DC | PRN
  Administered 2017-06-20: 1 via TOPICAL

## 2017-06-20 MED ORDER — KETAMINE HCL 10 MG/ML IJ SOLN
INTRAMUSCULAR | Status: DC | PRN
  Administered 2017-06-20: 50 mg via INTRAVENOUS

## 2017-06-20 MED ORDER — FENTANYL CITRATE (PF) 250 MCG/5ML IJ SOLN
INTRAMUSCULAR | Status: AC
  Filled 2017-06-20: qty 5

## 2017-06-20 MED ORDER — INSULIN ASPART 100 UNIT/ML ~~LOC~~ SOLN
0.0000 [IU] | SUBCUTANEOUS | Status: DC
  Administered 2017-06-20: 3 [IU] via SUBCUTANEOUS
  Administered 2017-06-21 (×4): 2 [IU] via SUBCUTANEOUS
  Administered 2017-06-21: 3 [IU] via SUBCUTANEOUS
  Administered 2017-06-21 – 2017-06-22 (×2): 2 [IU] via SUBCUTANEOUS
  Administered 2017-06-22: 3 [IU] via SUBCUTANEOUS
  Administered 2017-06-22: 2 [IU] via SUBCUTANEOUS

## 2017-06-20 MED ORDER — BUPIVACAINE LIPOSOME 1.3 % IJ SUSP
20.0000 mL | Freq: Once | INTRAMUSCULAR | Status: AC
  Administered 2017-06-20: 20 mL
  Filled 2017-06-20: qty 20

## 2017-06-20 MED ORDER — KETAMINE HCL 10 MG/ML IJ SOLN
INTRAMUSCULAR | Status: AC
  Filled 2017-06-20: qty 1

## 2017-06-20 MED ORDER — DOCUSATE SODIUM 100 MG PO CAPS
100.0000 mg | ORAL_CAPSULE | Freq: Two times a day (BID) | ORAL | Status: DC
  Administered 2017-06-20 – 2017-06-22 (×4): 100 mg via ORAL
  Filled 2017-06-20 (×4): qty 1

## 2017-06-20 MED ORDER — SODIUM CHLORIDE 0.9 % IV SOLN
INTRAVENOUS | Status: DC
  Administered 2017-06-20 – 2017-06-21 (×2): via INTRAVENOUS

## 2017-06-20 MED ORDER — DIPHENHYDRAMINE HCL 12.5 MG/5ML PO ELIX: 12.5000 mg | ORAL_SOLUTION | Freq: Four times a day (QID) | ORAL | Status: DC | PRN

## 2017-06-20 MED ORDER — HYDROMORPHONE HCL 2 MG/ML IJ SOLN
INTRAMUSCULAR | Status: AC
  Filled 2017-06-20: qty 1

## 2017-06-20 MED ORDER — MIDAZOLAM HCL 2 MG/2ML IJ SOLN
INTRAMUSCULAR | Status: AC
  Filled 2017-06-20: qty 2

## 2017-06-20 MED ORDER — LIDOCAINE HCL 2 % IJ SOLN
INTRAMUSCULAR | Status: AC
  Filled 2017-06-20: qty 20

## 2017-06-20 MED ORDER — LIDOCAINE 20MG/ML (2%) 15 ML SYRINGE OPTIME
INTRAMUSCULAR | Status: DC | PRN
  Administered 2017-06-20: 1.5 mg/kg/h via INTRAVENOUS

## 2017-06-20 MED ORDER — FLEET ENEMA 7-19 GM/118ML RE ENEM: 1.0000 | ENEMA | Freq: Once | RECTAL | Status: DC

## 2017-06-20 MED ORDER — LACTATED RINGERS IV SOLN
INTRAVENOUS | Status: DC
  Administered 2017-06-20: 09:00:00 via INTRAVENOUS

## 2017-06-20 MED ORDER — FENTANYL CITRATE (PF) 100 MCG/2ML IJ SOLN: 25.0000 ug | INTRAMUSCULAR | Status: DC | PRN

## 2017-06-20 MED ORDER — DIPHENHYDRAMINE HCL 50 MG/ML IJ SOLN: 12.5000 mg | Freq: Four times a day (QID) | INTRAMUSCULAR | Status: DC | PRN

## 2017-06-20 MED ORDER — STERILE WATER FOR IRRIGATION IR SOLN
Status: DC | PRN
  Administered 2017-06-20: 1000 mL

## 2017-06-20 MED ORDER — DEXAMETHASONE SODIUM PHOSPHATE 10 MG/ML IJ SOLN
INTRAMUSCULAR | Status: AC
  Filled 2017-06-20: qty 1

## 2017-06-20 MED ORDER — CEFAZOLIN SODIUM-DEXTROSE 2-4 GM/100ML-% IV SOLN
2.0000 g | INTRAVENOUS | Status: AC
  Administered 2017-06-20: 2 g via INTRAVENOUS
  Filled 2017-06-20: qty 100

## 2017-06-20 MED ORDER — PROMETHAZINE HCL 25 MG/ML IJ SOLN: 6.2500 mg | INTRAMUSCULAR | Status: DC | PRN

## 2017-06-20 MED ORDER — HYDROCODONE-ACETAMINOPHEN 5-325 MG PO TABS: 1.0000 | ORAL_TABLET | Freq: Four times a day (QID) | ORAL | 0 refills | Status: AC | PRN

## 2017-06-20 MED ORDER — LIDOCAINE HCL (CARDIAC) PF 100 MG/5ML IV SOSY
PREFILLED_SYRINGE | INTRAVENOUS | Status: DC | PRN
  Administered 2017-06-20: 100 mg via INTRAVENOUS

## 2017-06-20 MED ORDER — MORPHINE SULFATE (PF) 4 MG/ML IV SOLN
2.0000 mg | INTRAVENOUS | Status: DC | PRN
  Administered 2017-06-20 – 2017-06-21 (×2): 2 mg via INTRAVENOUS
  Filled 2017-06-20 (×2): qty 1

## 2017-06-20 MED ORDER — ONDANSETRON HCL 4 MG/2ML IJ SOLN
4.0000 mg | INTRAMUSCULAR | Status: DC | PRN
  Administered 2017-06-21 – 2017-06-22 (×3): 4 mg via INTRAVENOUS
  Filled 2017-06-20 (×3): qty 2

## 2017-06-20 MED ORDER — LEVOTHYROXINE SODIUM 25 MCG PO TABS
93.7500 ug | ORAL_TABLET | Freq: Every day | ORAL | Status: DC
  Administered 2017-06-21 – 2017-06-22 (×2): 93.5 ug via ORAL
  Filled 2017-06-20 (×3): qty 1

## 2017-06-20 MED ORDER — SUGAMMADEX SODIUM 500 MG/5ML IV SOLN
INTRAVENOUS | Status: DC | PRN
  Administered 2017-06-20: 250 mg via INTRAVENOUS

## 2017-06-20 MED ORDER — FENTANYL CITRATE (PF) 100 MCG/2ML IJ SOLN
INTRAMUSCULAR | Status: DC | PRN
  Administered 2017-06-20 (×2): 50 ug via INTRAVENOUS
  Administered 2017-06-20: 100 ug via INTRAVENOUS
  Administered 2017-06-20: 50 ug via INTRAVENOUS

## 2017-06-20 MED ORDER — DEXAMETHASONE SODIUM PHOSPHATE 10 MG/ML IJ SOLN
INTRAMUSCULAR | Status: DC | PRN
  Administered 2017-06-20: 10 mg via INTRAVENOUS

## 2017-06-20 MED ORDER — PROPOFOL 10 MG/ML IV BOLUS
INTRAVENOUS | Status: DC | PRN
  Administered 2017-06-20: 20 mg via INTRAVENOUS
  Administered 2017-06-20: 150 mg via INTRAVENOUS

## 2017-06-20 MED ORDER — ORAL CARE MOUTH RINSE
15.0000 mL | Freq: Two times a day (BID) | OROMUCOSAL | Status: DC
  Administered 2017-06-20 – 2017-06-21 (×3): 15 mL via OROMUCOSAL

## 2017-06-20 MED ORDER — SUGAMMADEX SODIUM 200 MG/2ML IV SOLN
INTRAVENOUS | Status: AC
  Filled 2017-06-20: qty 2

## 2017-06-20 MED ORDER — MAGNESIUM CITRATE PO SOLN: 1.0000 | Freq: Once | ORAL | Status: DC

## 2017-06-20 MED ORDER — ONDANSETRON HCL 4 MG/2ML IJ SOLN
INTRAMUSCULAR | Status: AC
  Filled 2017-06-20: qty 2

## 2017-06-20 MED ORDER — ROCURONIUM BROMIDE 10 MG/ML (PF) SYRINGE
PREFILLED_SYRINGE | INTRAVENOUS | Status: AC
  Filled 2017-06-20: qty 5

## 2017-06-20 MED ORDER — SODIUM CHLORIDE 0.9 % IJ SOLN
INTRAMUSCULAR | Status: AC
  Filled 2017-06-20: qty 20

## 2017-06-20 MED ORDER — SODIUM CHLORIDE 0.9 % IJ SOLN
INTRAMUSCULAR | Status: AC
  Filled 2017-06-20: qty 10

## 2017-06-20 MED ORDER — HYDROMORPHONE HCL 1 MG/ML IJ SOLN
INTRAMUSCULAR | Status: DC | PRN
  Administered 2017-06-20 (×2): 0.5 mg via INTRAVENOUS
  Administered 2017-06-20: 1 mg via INTRAVENOUS

## 2017-06-20 MED ORDER — BUPIVACAINE-EPINEPHRINE (PF) 0.5% -1:200000 IJ SOLN
INTRAMUSCULAR | Status: AC
  Filled 2017-06-20: qty 30

## 2017-06-20 MED ORDER — LACTATED RINGERS IV SOLN
INTRAVENOUS | Status: DC | PRN
  Administered 2017-06-20 (×2): via INTRAVENOUS

## 2017-06-20 MED ORDER — BUPIVACAINE HCL (PF) 0.25 % IJ SOLN
INTRAMUSCULAR | Status: AC
  Filled 2017-06-20: qty 30

## 2017-06-20 MED ORDER — PROPOFOL 10 MG/ML IV BOLUS
INTRAVENOUS | Status: AC
  Filled 2017-06-20: qty 20

## 2017-06-20 MED ORDER — PNEUMOCOCCAL VAC POLYVALENT 25 MCG/0.5ML IJ INJ
0.5000 mL | INJECTION | INTRAMUSCULAR | Status: AC
  Administered 2017-06-22: 0.5 mL via INTRAMUSCULAR
  Filled 2017-06-20: qty 0.5

## 2017-06-20 MED ORDER — LORATADINE 10 MG PO TABS
10.0000 mg | ORAL_TABLET | Freq: Every day | ORAL | Status: DC
  Administered 2017-06-21 – 2017-06-22 (×2): 10 mg via ORAL
  Filled 2017-06-20 (×2): qty 1

## 2017-06-20 SURGICAL SUPPLY — 52 items
APPLICATOR SURGIFLO ENDO (HEMOSTASIS) ×3 IMPLANT
CHLORAPREP W/TINT 26ML (MISCELLANEOUS) ×3 IMPLANT
CLIP VESOLOCK LG 6/CT PURPLE (CLIP) ×3 IMPLANT
CLIP VESOLOCK MED LG 6/CT (CLIP) ×6 IMPLANT
COVER SURGICAL LIGHT HANDLE (MISCELLANEOUS) ×3 IMPLANT
COVER TIP SHEARS 8 DVNC (MISCELLANEOUS) ×1 IMPLANT
COVER TIP SHEARS 8MM DA VINCI (MISCELLANEOUS) ×2
DECANTER SPIKE VIAL GLASS SM (MISCELLANEOUS) ×3 IMPLANT
DERMABOND ADVANCED (GAUZE/BANDAGES/DRESSINGS) ×2
DERMABOND ADVANCED .7 DNX12 (GAUZE/BANDAGES/DRESSINGS) ×1 IMPLANT
DRAIN CHANNEL 15F RND FF 3/16 (WOUND CARE) IMPLANT
DRAPE ARM DVNC X/XI (DISPOSABLE) ×4 IMPLANT
DRAPE COLUMN DVNC XI (DISPOSABLE) ×1 IMPLANT
DRAPE DA VINCI XI ARM (DISPOSABLE) ×8
DRAPE DA VINCI XI COLUMN (DISPOSABLE) ×2
DRAPE INCISE IOBAN 66X45 STRL (DRAPES) ×3 IMPLANT
DRAPE SHEET LG 3/4 BI-LAMINATE (DRAPES) ×3 IMPLANT
ELECT PENCIL ROCKER SW 15FT (MISCELLANEOUS) ×3 IMPLANT
ELECT REM PT RETURN 15FT ADLT (MISCELLANEOUS) ×3 IMPLANT
EVACUATOR SILICONE 100CC (DRAIN) ×3 IMPLANT
GLOVE BIO SURGEON STRL SZ 6.5 (GLOVE) ×2 IMPLANT
GLOVE BIO SURGEONS STRL SZ 6.5 (GLOVE) ×1
GLOVE BIOGEL M STRL SZ7.5 (GLOVE) ×6 IMPLANT
GOWN STRL REUS W/TWL LRG LVL3 (GOWN DISPOSABLE) ×9 IMPLANT
IRRIG SUCT STRYKERFLOW 2 WTIP (MISCELLANEOUS)
IRRIGATION SUCT STRKRFLW 2 WTP (MISCELLANEOUS) IMPLANT
KIT BASIN OR (CUSTOM PROCEDURE TRAY) ×3 IMPLANT
MARKER SKIN DUAL TIP RULER LAB (MISCELLANEOUS) ×3 IMPLANT
NS IRRIG 1000ML POUR BTL (IV SOLUTION) IMPLANT
POSITIONER SURGICAL ARM (MISCELLANEOUS) ×3 IMPLANT
POUCH SPECIMEN RETRIEVAL 10MM (ENDOMECHANICALS) ×3 IMPLANT
SEAL CANN UNIV 5-8 DVNC XI (MISCELLANEOUS) ×4 IMPLANT
SEAL XI 5MM-8MM UNIVERSAL (MISCELLANEOUS) ×8
SOLUTION ELECTROLUBE (MISCELLANEOUS) ×3 IMPLANT
SURGIFLO W/THROMBIN 8M KIT (HEMOSTASIS) ×3 IMPLANT
SUT ETHILON 3 0 PS 1 (SUTURE) IMPLANT
SUT MNCRL AB 4-0 PS2 18 (SUTURE) ×6 IMPLANT
SUT PDS AB 0 CTX 36 PDP370T (SUTURE) IMPLANT
SUT V-LOC BARB 180 2/0GR6 GS22 (SUTURE) ×3
SUT VIC AB 0 CT1 27 (SUTURE) ×2
SUT VIC AB 0 CT1 27XBRD ANTBC (SUTURE) ×1 IMPLANT
SUT VICRYL 0 UR6 27IN ABS (SUTURE) ×6 IMPLANT
SUT VLOC BARB 180 ABS3/0GR12 (SUTURE) ×6
SUTURE V-LC BRB 180 2/0GR6GS22 (SUTURE) ×1 IMPLANT
SUTURE VLOC BRB 180 ABS3/0GR12 (SUTURE) ×2 IMPLANT
TOWEL OR 17X26 10 PK STRL BLUE (TOWEL DISPOSABLE) ×3 IMPLANT
TOWEL OR NON WOVEN STRL DISP B (DISPOSABLE) ×3 IMPLANT
TRAY FOLEY W/METER SILVER 16FR (SET/KITS/TRAYS/PACK) ×3 IMPLANT
TRAY LAPAROSCOPIC (CUSTOM PROCEDURE TRAY) ×3 IMPLANT
TROCAR UNIVERSAL OPT 12M 100M (ENDOMECHANICALS) IMPLANT
TROCAR XCEL 12X100 BLDLESS (ENDOMECHANICALS) ×3 IMPLANT
WATER STERILE IRR 1000ML POUR (IV SOLUTION) IMPLANT

## 2017-06-20 NOTE — Transfer of Care (Signed)
Immediate Anesthesia Transfer of Care Note  Patient: Shawn Mccarty  Procedure(s) Performed: XI ROBOTIC ASSISTED LAPAROSCOPIC PARTIAL NEPHRECTOMY (Right )  Patient Location: PACU  Anesthesia Type:General  Level of Consciousness: drowsy, patient cooperative and responds to stimulation  Airway & Oxygen Therapy: Patient Spontanous Breathing and Patient connected to face mask oxygen  Post-op Assessment: Report given to RN and Post -op Vital signs reviewed and stable  Post vital signs: Reviewed and stable  Last Vitals:  Vitals Value Taken Time  BP 143/93 06/20/2017  4:37 PM  Temp    Pulse 95 06/20/2017  4:39 PM  Resp 11 06/20/2017  4:39 PM  SpO2 97 % 06/20/2017  4:39 PM  Vitals shown include unvalidated device data.  Last Pain:  Vitals:   06/20/17 0928  TempSrc:   PainSc: 0-No pain      Patients Stated Pain Goal: 4 (16/10/96 0454)  Complications: No apparent anesthesia complications

## 2017-06-20 NOTE — Anesthesia Postprocedure Evaluation (Signed)
Anesthesia Post Note  Patient: Shawn Mccarty  Procedure(s) Performed: XI ROBOTIC ASSISTED LAPAROSCOPIC PARTIAL NEPHRECTOMY (Right )     Anesthesia Post Evaluation  Last Vitals:  Vitals:   06/20/17 0921 06/20/17 1637  BP: 134/84   Pulse: 77   Resp: 16   Temp: 36.7 C (P) 36.6 C  SpO2: 100%     Last Pain:  Vitals:   06/20/17 1637  TempSrc:   PainSc: (P) Asleep                 Christifer Chapdelaine GLENN

## 2017-06-20 NOTE — Anesthesia Procedure Notes (Signed)
Procedure Name: Intubation Date/Time: 06/20/2017 11:53 AM Performed by: Glory Buff, CRNA Pre-anesthesia Checklist: Patient identified, Emergency Drugs available, Suction available and Patient being monitored Patient Re-evaluated:Patient Re-evaluated prior to induction Oxygen Delivery Method: Circle system utilized Preoxygenation: Pre-oxygenation with 100% oxygen Induction Type: IV induction Ventilation: Mask ventilation without difficulty Laryngoscope Size: Glidescope and 4 Grade View: Grade I Tube type: Oral Tube size: 7.5 mm Number of attempts: 1 Airway Equipment and Method: Stylet and Oral airway Placement Confirmation: ETT inserted through vocal cords under direct vision,  positive ETCO2 and breath sounds checked- equal and bilateral Secured at: 22 cm Tube secured with: Tape Dental Injury: Teeth and Oropharynx as per pre-operative assessment

## 2017-06-20 NOTE — Discharge Instructions (Signed)

## 2017-06-20 NOTE — Progress Notes (Signed)
Patient ID: AMBROSE WILE, male   DOB: Jan 05, 1956, 62 y.o.   MRN: [id number]  Post-op note  Subjective: The patient is doing well.  No complaints.  Still somnolent after anesthesia.  Objective: Vital signs in last 24 hours: Temp:  [97.9 F (36.6 C)-98.1 F (36.7 C)] 97.9 F (36.6 C) (05/06 1637) Pulse Rate:  [77-93] 86 (05/06 1715) Resp:  [0-16] 0 (05/06 1715) BP: (134-147)/(84-93) 137/90 (05/06 1715) SpO2:  [95 %-100 %] 95 % (05/06 1715) Weight:  [104.8 kg (231 lb)] 104.8 kg (231 lb) (05/06 0928)  Intake/Output from previous day: No intake/output data recorded. Intake/Output this shift: Total I/O In: 2817 [I.V.:2717; IV Piggyback:100] Out: 455 [Urine:315; Drains:40; Blood:100]  Physical Exam:  General: Alert and oriented. Abdomen: Soft, Nondistended. Incisions: Clean and dry.  Lab Results:   Assessment/Plan: POD#0   1) Continue to monitor, bedrest tonight  Pryor Curia. MD   LOS: 0 days   Yumi Insalaco,LES 06/20/2017, 5:20 PM

## 2017-06-20 NOTE — Anesthesia Preprocedure Evaluation (Addendum)
Anesthesia Evaluation  Patient identified by MRN, date of birth, ID band Patient awake    Reviewed: Allergy & Precautions, NPO status , Patient's Chart, lab work & pertinent test results  Airway Mallampati: III  TM Distance: >3 FB Neck ROM: Full    Dental  (+) Teeth Intact, Dental Advisory Given   Pulmonary sleep apnea and Continuous Positive Airway Pressure Ventilation , former smoker,    Pulmonary exam normal breath sounds clear to auscultation       Cardiovascular hypertension, Pt. on medications Normal cardiovascular exam Rhythm:Regular Rate:Normal     Neuro/Psych  Headaches, negative psych ROS   GI/Hepatic negative GI ROS, Neg liver ROS,   Endo/Other  diabetes, Type 2, Oral Hypoglycemic AgentsHypothyroidism Obesity   Renal/GU Renal InsufficiencyRenal diseaseRCC     Musculoskeletal  (+) Arthritis , Osteoarthritis,    Abdominal   Peds  Hematology negative hematology ROS (+)   Anesthesia Other Findings Day of surgery medications reviewed with the patient.  Reproductive/Obstetrics                            Anesthesia Physical Anesthesia Plan  ASA: III  Anesthesia Plan: General   Post-op Pain Management:    Induction: Intravenous  PONV Risk Score and Plan: 3 and Dexamethasone, Ondansetron, Midazolam and Scopolamine patch - Pre-op  Airway Management Planned: Oral ETT  Additional Equipment:   Intra-op Plan:   Post-operative Plan: Extubation in OR  Informed Consent: I have reviewed the patients History and Physical, chart, labs and discussed the procedure including the risks, benefits and alternatives for the proposed anesthesia with the patient or authorized representative who has indicated his/her understanding and acceptance.   Dental advisory given  Plan Discussed with: CRNA  Anesthesia Plan Comments: (2nd IV after induction.)        Anesthesia Quick Evaluation

## 2017-06-20 NOTE — H&P (Signed)
CC/HPI: Right renal neoplasm   Mr. Shawn Mccarty is a 62 year old gentleman seen today at the request of Dr. Linna Darner for an incidentally detected 5.6 cm right renal neoplasm. Over the past 3 weeks, he has developed left-sided flank pain with radiation to his abdomen along with a flank bulge. This has been exacerbated with increased activity. Ultimately, this led him to get a CT scan of the abdomen and pelvis with contrast on 04/29/2017. This incidentally demonstrated a 5.6 x 5.3 cm heterogeneous mass off the lateral aspect of the lower pole of the kidney consistent with a possible renal malignancy. He also was noted to have a small 16 mm nodule along the inferior aspect of the left adrenal gland consistent with an adenoma. No evidence of renal vein or IVC involvement was noted. No contralateral renal tumors were noted. No regional lymphadenopathy was noted. No clear etiology for his left-sided pain was noted. He has no family history of kidney cancer. He does have hypertension and diabetes that had been relatively well managed.     ALLERGIES: None    Notes: pt can not combine antibiotic medication with steroid medication due to muscle tears.   MEDICATIONS: Cialis 5 mg tablet  Lisinopril 5 mg tablet  Ibuprofen  Meloxicam  Metformin Hcl Er  Methocarbamol  Synthroid     GU PSH: None   NON-GU PSH: Appendectomy (open) Tonsillectomy    GU PMH: None   NON-GU PMH: Diabetes Type 2 Hypertension Hypothyroidism    FAMILY HISTORY: 1 Daughter - Runs in Family 1 son - Runs in Family Barrett's Esophagus - Father Myocardial Infarction - Mother   SOCIAL HISTORY: Marital Status: Divorced Preferred Language: English; Ethnicity: Not Hispanic Or Latino; Race: White Current Smoking Status: Patient does not smoke anymore.   Tobacco Use Assessment Completed: Used Tobacco in last 30 days? Does drink.  Drinks 4+ caffeinated drinks per day.    REVIEW OF SYSTEMS:    GU Review Male:   Patient  reports frequent urination, hard to postpone urination, get up at night to urinate, leakage of urine, stream starts and stops, and trouble starting your streams. Patient denies burning/ pain with urination and have to strain to urinate .  Gastrointestinal (Upper):   Patient denies nausea and vomiting.  Gastrointestinal (Lower):   Patient reports diarrhea and constipation.   Constitutional:   Patient reports fatigue. Patient denies fever, night sweats, and weight loss.  Skin:   Patient reports itching. Patient denies skin rash/ lesion.  Eyes:   Patient reports blurred vision. Patient denies double vision.  Ears/ Nose/ Throat:   Patient denies sore throat and sinus problems.  Hematologic/Lymphatic:   Patient denies swollen glands and easy bruising.  Cardiovascular:   Patient reports leg swelling. Patient denies chest pains.  Respiratory:   Patient denies cough and shortness of breath.  Endocrine:   Patient reports excessive thirst.   Musculoskeletal:   Patient reports back pain. Patient denies joint pain.  Neurological:   Patient denies headaches and dizziness.  Psychologic:   Patient denies depression and anxiety.   VITAL SIGNS:     Weight 228 lb / 103.42 kg  Height 67 in / 170.18 cm  BMI 35.7 kg/m   MULTI-SYSTEM PHYSICAL EXAMINATION:    Constitutional: Well-nourished. No physical deformities. Normally developed. Good grooming.  Neck: Neck symmetrical, not swollen. Normal tracheal position.  Respiratory: No labored breathing, no use of accessory muscles. Normal breath sounds. Clear bilaterally.  Cardiovascular: Regular rate and rhythm. No murmur,  no gallop. Normal temperature, normal extremity pulses, no swelling, no varicosities.  Lymphatic: No enlargement of neck, axillae, groin.  Skin: No paleness, no jaundice, no cyanosis. No lesion, no ulcer, no rash.  Neurologic / Psychiatric: Oriented to time, oriented to place, oriented to person. No depression, no anxiety, no agitation.   Gastrointestinal: No mass, no tenderness, no rigidity. He is obese. He does have an apparent left-sided flank bulge without hernia. This is mildly tender on palpation when standing. He has a small right lower quadrant incision from his prior appendectomy. He did have a perforated appendix and also has a small lower midline incision apparently related to re-exploration and drain placement many years ago.  Eyes: Normal conjunctivae. Normal eyelids.  Ears, Nose, Mouth, and Throat: Left ear no scars, no lesions, no masses. Right ear no scars, no lesions, no masses. Nose no scars, no lesions, no masses. Normal hearing. Normal lips.  Musculoskeletal: Normal gait and station of head and neck.       ASSESSMENT:      ICD-10 Details  1 GU:   Right renal neoplasm - D49.511    PLAN:       1. Right renal neoplasm concerning for malignancy: I have recommended that he proceed with a right robot assisted laparoscopic partial nephrectomy. He does understand the risk of a potential need for total nephrectomy or open surgical conversion considering the larger size of his mass and considering his prior surgical history.

## 2017-06-20 NOTE — Op Note (Signed)
Preoperative diagnosis: Right renal neoplasm  Postoperative diagnosis: Right renal neoplasm  Procedure:  1. Right robotic-assisted laparoscopic partial nephrectomy 2. Intraoperative renal ultrasonography  Surgeon: Pryor Curia. M.D.  Assistant(s): Debbrah Alar, PA-C  An assistant was required for this surgical procedure.  The duties of the assistant included but were not limited to suctioning, passing suture, camera manipulation, retraction. This procedure would not be able to be performed without an Environmental consultant.  Resident: Dr. Fredrik Rigger  Anesthesia: General  Complications: None  EBL: 100 mL  IVF:  1700 mL crystalloid  Specimens: 1. Right renal neoplasm  Disposition of specimens: Pathology  Intraoperative findings:       1. Warm renal ischemia time: 21 minutes       2. Intraoperative renal ultrasound findings: A solid appearing tumor measuring about 6.0 cm was identified in the lower pole, anterior right kidney.  Drains: 1. # 15 Blake perinephric drain  Indication:  Shawn Mccarty is a 62 y.o. year old patient with a right renal mass.  After a thorough review of the management options for their renal mass, they elected to proceed with surgical treatment and the above procedure.  We have discussed the potential benefits and risks of the procedure, side effects of the proposed treatment, the likelihood of the patient achieving the goals of the procedure, and any potential problems that might occur during the procedure or recuperation. Informed consent has been obtained.   Description of procedure:  The patient was taken to the operating room and a general anesthetic was administered. The patient was given preoperative antibiotics, placed in the right modified flank position with care to pad all potential pressure points, and prepped and draped in the usual sterile fashion. Next a preoperative timeout was performed.  A site was selected on in the midline for  placement of the assistant port. This was placed using a standard open Hassan technique which allowed entry into the peritoneal cavity under direct vision and without difficulty. A 12 mm port was placed and a pneumoperitoneum established. The camera was then used to inspect the abdomen and there was no evidence of any intra-abdominal injuries or other abnormalities. The remaining abdominal ports were then placed. 8 mm robotic ports were placed in the right upper quadrant, right lower quadrant, and far right lateral abdominal wall. An 8 mm port was placed for the camera site just right of the umbilicus. All ports were placed under direct vision without difficulty. The surgical cart was then docked.   Utilizing the cautery scissors, the white line of Toldt was incised allowing the colon to be mobilized medially and the plane between the mesocolon and the anterior layer of Gerota's fascia to be developed and the kidney to be exposed.  The ureter and gonadal vein were identified inferiorly and the ureter was lifted anteriorly off the psoas muscle. However, there was noted to be an excessive amount of perinephric fat causing anterior retraction on the kidney to be difficult. Dissection proceeded superiorly along the inferior vena cava until the renal vein was identified.  The renal hilum was then carefully isolated with a combination of blunt and sharp dissection allowing the renal arterial and venous structures to be separated and isolated in preparation for renal hilar vessel clamping. There was as single renal vein and the renal artery just inferior to the vein.    Attention turned to the kidney and the perinephric fat surrounding the renal mass was removed and the kidney was mobilized sufficiently for  exposure and resection of the renal mass.  The mass was identified off the lower, anterior right kidney.  Intraoperative renal ultrasonography was utilized with the laparoscopic ultrasound probe to identify the  renal tumor and identify the tumor margins.   Once the renal mass was properly isolated, preparations were made for resection of the tumor.  Reconstructive sutures were placed into the abdomen for the renorrhaphy portion of the procedure.  The renal artery was then clamped with two bulldog clamps.  The tumor was then excised with cold scissor dissection along with an adequate visible gross margin of normal renal parenchyma. The tumor appeared to be excised without any gross violation of the tumor. The renal collecting system was entered during removal of the tumor.  Two running 3-0 V-lock sutures were then used for the deep renorrhaphy.  Each suture was brought through the capsule of the kidney and run along the base of the renal defect to provide hemostasis and close any entry into the renal collecting system if present. Weck clips were used to secure this suture outside the renal capsule at the proximal and distal ends. An additional hemostatic agent (Floseal) was then placed into the renal defect. A running 2-0 V lock suture was then used to close the capsule of the kidney using a sliding clip technique which resulted in excellent hemostasis.    The bulldog clamps were then removed from the renal hilar vessel(s). Total warm renal ischemia time was 21 minutes. The renal tumor resection site was examined. Hemostasis appeared adequate.   The kidney was placed back into its normal anatomic position and covered with perinephric fat as needed.  A # 71 Blake drain was then brought through the lateral lower port site and positioned in the perinephric space.  It was secured to the skin with a nylon suture. The surgical cart was undocked.  The renal tumor specimen was removed intact within an endopouch retrieval bag via the upper midline port site. This incision site was closed at the fascial layer with 0-vicryl suture. All other laparoscopic/robotic ports were removed under direct vision and the pneumoperitoneum let  down with inspection of the operative field performed and hemostasis again confirmed. All incision sites were then injected with local anesthetic and reapproximated at the skin level with 4-0 monocryl subcuticular closures.  Dermabond was applied to the skin.  The patient tolerated the procedure well and without complications.  The patient was able to be extubated and transferred to the recovery unit in satisfactory condition.  Pryor Curia MD

## 2017-06-21 ENCOUNTER — Encounter (HOSPITAL_COMMUNITY): Payer: Self-pay | Admitting: Urology

## 2017-06-21 DIAGNOSIS — Z87891 Personal history of nicotine dependence: Secondary | ICD-10-CM | POA: Diagnosis not present

## 2017-06-21 DIAGNOSIS — Z79890 Hormone replacement therapy (postmenopausal): Secondary | ICD-10-CM | POA: Diagnosis not present

## 2017-06-21 DIAGNOSIS — G4730 Sleep apnea, unspecified: Secondary | ICD-10-CM | POA: Diagnosis not present

## 2017-06-21 DIAGNOSIS — Z23 Encounter for immunization: Secondary | ICD-10-CM | POA: Diagnosis not present

## 2017-06-21 DIAGNOSIS — Z79899 Other long term (current) drug therapy: Secondary | ICD-10-CM | POA: Diagnosis not present

## 2017-06-21 DIAGNOSIS — C641 Malignant neoplasm of right kidney, except renal pelvis: Secondary | ICD-10-CM | POA: Diagnosis not present

## 2017-06-21 DIAGNOSIS — E119 Type 2 diabetes mellitus without complications: Secondary | ICD-10-CM | POA: Diagnosis not present

## 2017-06-21 DIAGNOSIS — Z791 Long term (current) use of non-steroidal anti-inflammatories (NSAID): Secondary | ICD-10-CM | POA: Diagnosis not present

## 2017-06-21 DIAGNOSIS — Z9989 Dependence on other enabling machines and devices: Secondary | ICD-10-CM | POA: Diagnosis not present

## 2017-06-21 DIAGNOSIS — I10 Essential (primary) hypertension: Secondary | ICD-10-CM | POA: Diagnosis not present

## 2017-06-21 DIAGNOSIS — E039 Hypothyroidism, unspecified: Secondary | ICD-10-CM | POA: Diagnosis not present

## 2017-06-21 DIAGNOSIS — D49511 Neoplasm of unspecified behavior of right kidney: Secondary | ICD-10-CM | POA: Diagnosis not present

## 2017-06-21 DIAGNOSIS — E669 Obesity, unspecified: Secondary | ICD-10-CM | POA: Diagnosis not present

## 2017-06-21 DIAGNOSIS — Z6836 Body mass index (BMI) 36.0-36.9, adult: Secondary | ICD-10-CM | POA: Diagnosis not present

## 2017-06-21 DIAGNOSIS — Z7984 Long term (current) use of oral hypoglycemic drugs: Secondary | ICD-10-CM | POA: Diagnosis not present

## 2017-06-21 LAB — BASIC METABOLIC PANEL
ANION GAP: 8 (ref 5–15)
BUN: 13 mg/dL (ref 6–20)
CHLORIDE: 106 mmol/L (ref 101–111)
CO2: 25 mmol/L (ref 22–32)
Calcium: 8.5 mg/dL — ABNORMAL LOW (ref 8.9–10.3)
Creatinine, Ser: 1.37 mg/dL — ABNORMAL HIGH (ref 0.61–1.24)
GFR calc Af Amer: >60 mL/min (ref >60)
GFR calc non Af Amer: 54 mL/min — ABNORMAL LOW (ref >60)
GLUCOSE: 157 mg/dL — AB (ref 65–99)
Potassium: 4.4 mmol/L (ref 3.5–5.1)
Sodium: 139 mmol/L (ref 135–145)

## 2017-06-21 LAB — HEMOGLOBIN AND HEMATOCRIT, BLOOD
HCT: 37.4 % — ABNORMAL LOW (ref 39.0–52.0)
HEMOGLOBIN: 12.2 g/dL — AB (ref 13.0–17.0)

## 2017-06-21 LAB — GLUCOSE, CAPILLARY
GLUCOSE-CAPILLARY: 133 mg/dL — AB (ref 65–99)
GLUCOSE-CAPILLARY: 138 mg/dL — AB (ref 65–99)
GLUCOSE-CAPILLARY: 145 mg/dL — AB (ref 65–99)
Glucose-Capillary: 122 mg/dL — ABNORMAL HIGH (ref 65–99)
Glucose-Capillary: 129 mg/dL — ABNORMAL HIGH (ref 65–99)
Glucose-Capillary: 170 mg/dL — ABNORMAL HIGH (ref 65–99)
Glucose-Capillary: 189 mg/dL — ABNORMAL HIGH (ref 65–99)

## 2017-06-21 LAB — CREATININE, FLUID (PLEURAL, PERITONEAL, JP DRAINAGE): Creat, Fluid: 1.4 mg/dL

## 2017-06-21 MED ORDER — HYDROCODONE-ACETAMINOPHEN 5-325 MG PO TABS
1.0000 | ORAL_TABLET | Freq: Four times a day (QID) | ORAL | Status: DC | PRN
  Administered 2017-06-21 – 2017-06-22 (×4): 2 via ORAL
  Filled 2017-06-21 (×4): qty 2

## 2017-06-21 MED ORDER — PHENAZOPYRIDINE HCL 100 MG PO TABS
100.0000 mg | ORAL_TABLET | Freq: Three times a day (TID) | ORAL | Status: DC | PRN
  Administered 2017-06-21: 100 mg via ORAL
  Filled 2017-06-21: qty 1

## 2017-06-21 MED ORDER — BISACODYL 10 MG RE SUPP
10.0000 mg | Freq: Once | RECTAL | Status: AC
  Administered 2017-06-21: 10 mg via RECTAL
  Filled 2017-06-21: qty 1

## 2017-06-21 NOTE — Addendum Note (Signed)
Addendum  created 06/21/17 7014 by Lollie Sails, CRNA   Charge Capture section accepted

## 2017-06-21 NOTE — Progress Notes (Signed)
Patient ID: Shawn Mccarty, male   DOB: 08/02/1955, 62 y.o.   MRN: [id number]  1 Day Post-Op Subjective: Pt doing well.  Pain controlled.  No nausea.  Objective: Vital signs in last 24 hours: Temp:  [97.9 F (36.6 C)-98.9 F (37.2 C)] 98.9 F (37.2 C) (05/07 0741) Pulse Rate:  [64-93] 68 (05/07 0741) Resp:  [8-18] 18 (05/07 0511) BP: (118-147)/(68-93) 126/74 (05/07 0741) SpO2:  [91 %-100 %] 96 % (05/07 0741) Weight:  [104.8 kg (231 lb)] 104.8 kg (231 lb) (05/06 0928)  Intake/Output from previous day: 05/06 0701 - 05/07 0700 In: 4525.3 [I.V.:4325.3; IV Piggyback:200] Out: 4401 [Urine:1110; Drains:267; Blood:100] Intake/Output this shift: Total I/O In: 240 [P.O.:240] Out: -   Physical Exam:  General: Alert and oriented CV: RRR Lungs: Clear Abdomen: Soft, ND Incisions: C/D/I Ext: NT, No erythema  Lab Results: Recent Labs    06/20/17 1715 06/21/17 0530  HGB 13.9 12.2*  HCT 41.2 37.4*   BMET Recent Labs    06/20/17 1715 06/21/17 0530  NA 141 139  K 4.3 4.4  CL 106 106  CO2 26 25  GLUCOSE 167* 157*  BUN 12 13  CREATININE 1.28* 1.37*  CALCIUM 8.9 8.5*     Studies/Results: No results found.  Assessment/Plan: POD # 1 s/p right RAL partial nephrectomy - Ambulate, IS - Advance diet - D/C catheter - Monitor renal function, Hgb - Path pending   LOS: 0 days   Luba Matzen,LES 06/21/2017, 8:12 AM

## 2017-06-21 NOTE — Progress Notes (Signed)
Urology Progress Note   1 Day Post-Op  Subjective: NAEON. Pain controlled with oral medications. Incisions c/d/i. JP with 260cc out, UOP adequate - 1L. Tolerating clears   Objective: Vital signs in last 24 hours: Temp:  [97.9 F (36.6 C)-98.8 F (37.1 C)] 98.6 F (37 C) (05/07 0511) Pulse Rate:  [64-93] 68 (05/07 0741) Resp:  [8-18] 18 (05/07 0511) BP: (118-147)/(68-93) 126/74 (05/07 0741) SpO2:  [91 %-100 %] 96 % (05/07 0741) Weight:  [104.8 kg (231 lb)] 104.8 kg (231 lb) (05/06 0928)  Intake/Output from previous day: 05/06 0701 - 05/07 0700 In: 4525.3 [I.V.:4325.3; IV Piggyback:200] Out: 6803 [Urine:1110; Drains:267; Blood:100] Intake/Output this shift: No intake/output data recorded.  Physical Exam:  General: Alert and oriented CV: RRR Lungs: Clear Abdomen: Soft, appropriately tender. Incisions c/d/i. JP in LLQ with SS output GU: Foley in place draining clear yellow urine  Ext: NT, No erythema  Lab Results: Recent Labs    06/20/17 1715 06/21/17 0530  HGB 13.9 12.2*  HCT 41.2 37.4*   BMET Recent Labs    06/20/17 1715 06/21/17 0530  NA 141 139  K 4.3 4.4  CL 106 106  CO2 26 25  GLUCOSE 167* 157*  BUN 12 13  CREATININE 1.28* 1.37*  CALCIUM 8.9 8.5*     Studies/Results: No results found.  Assessment/Plan:  62 y.o. male s/p Right partial Nx.  Overall doing well post-op.   - DC foley - Medlock  - Regular diet  - OK to ambulate  - Continue home meds - JP cr this afternoon    Dispo: Tomorrow    LOS: 0 days   Alla Feeling, MD 06/21/2017, 7:47 AM

## 2017-06-22 DIAGNOSIS — G4730 Sleep apnea, unspecified: Secondary | ICD-10-CM | POA: Diagnosis not present

## 2017-06-22 DIAGNOSIS — Z9989 Dependence on other enabling machines and devices: Secondary | ICD-10-CM | POA: Diagnosis not present

## 2017-06-22 DIAGNOSIS — Z23 Encounter for immunization: Secondary | ICD-10-CM | POA: Diagnosis not present

## 2017-06-22 DIAGNOSIS — Z791 Long term (current) use of non-steroidal anti-inflammatories (NSAID): Secondary | ICD-10-CM | POA: Diagnosis not present

## 2017-06-22 DIAGNOSIS — C641 Malignant neoplasm of right kidney, except renal pelvis: Secondary | ICD-10-CM | POA: Diagnosis not present

## 2017-06-22 DIAGNOSIS — E039 Hypothyroidism, unspecified: Secondary | ICD-10-CM | POA: Diagnosis not present

## 2017-06-22 DIAGNOSIS — Z6836 Body mass index (BMI) 36.0-36.9, adult: Secondary | ICD-10-CM | POA: Diagnosis not present

## 2017-06-22 DIAGNOSIS — E669 Obesity, unspecified: Secondary | ICD-10-CM | POA: Diagnosis not present

## 2017-06-22 DIAGNOSIS — D49511 Neoplasm of unspecified behavior of right kidney: Secondary | ICD-10-CM | POA: Diagnosis not present

## 2017-06-22 DIAGNOSIS — Z79890 Hormone replacement therapy (postmenopausal): Secondary | ICD-10-CM | POA: Diagnosis not present

## 2017-06-22 DIAGNOSIS — I10 Essential (primary) hypertension: Secondary | ICD-10-CM | POA: Diagnosis not present

## 2017-06-22 DIAGNOSIS — Z7984 Long term (current) use of oral hypoglycemic drugs: Secondary | ICD-10-CM | POA: Diagnosis not present

## 2017-06-22 DIAGNOSIS — E119 Type 2 diabetes mellitus without complications: Secondary | ICD-10-CM | POA: Diagnosis not present

## 2017-06-22 DIAGNOSIS — Z87891 Personal history of nicotine dependence: Secondary | ICD-10-CM | POA: Diagnosis not present

## 2017-06-22 DIAGNOSIS — Z79899 Other long term (current) drug therapy: Secondary | ICD-10-CM | POA: Diagnosis not present

## 2017-06-22 LAB — BASIC METABOLIC PANEL
Anion gap: 9 (ref 5–15)
BUN: 14 mg/dL (ref 6–20)
CHLORIDE: 104 mmol/L (ref 101–111)
CO2: 25 mmol/L (ref 22–32)
CREATININE: 1.54 mg/dL — AB (ref 0.61–1.24)
Calcium: 8.6 mg/dL — ABNORMAL LOW (ref 8.9–10.3)
GFR calc Af Amer: 54 mL/min — ABNORMAL LOW (ref >60)
GFR calc non Af Amer: 47 mL/min — ABNORMAL LOW (ref >60)
GLUCOSE: 145 mg/dL — AB (ref 65–99)
POTASSIUM: 4 mmol/L (ref 3.5–5.1)
Sodium: 138 mmol/L (ref 135–145)

## 2017-06-22 LAB — HEMOGLOBIN AND HEMATOCRIT, BLOOD
HEMATOCRIT: 38.1 % — AB (ref 39.0–52.0)
HEMOGLOBIN: 12.5 g/dL — AB (ref 13.0–17.0)

## 2017-06-22 LAB — GLUCOSE, CAPILLARY
GLUCOSE-CAPILLARY: 137 mg/dL — AB (ref 65–99)
Glucose-Capillary: 150 mg/dL — ABNORMAL HIGH (ref 65–99)

## 2017-06-22 MED ORDER — BISACODYL 10 MG RE SUPP
10.0000 mg | Freq: Once | RECTAL | Status: AC
  Administered 2017-06-22: 10 mg via RECTAL
  Filled 2017-06-22: qty 1

## 2017-06-22 NOTE — Discharge Summary (Signed)
Date of admission: 06/20/2017  Date of discharge: 06/22/2017  Admission diagnosis: Right renal neoplasm  Discharge diagnosis: Right renal neoplasm  Secondary diagnoses: Diabetes  History and Physical: For full details, please see admission history and physical. Briefly, Shawn Mccarty is a 62 y.o. year old patient with a 6.1 cm right renal neoplasm.   Hospital Course: He was taken to the OR on 06/20/17 and underwent a right RAL partial nephrectomy.  He remained hemodynamically stable and was able to begin ambulating on POD#1.  His diet was gradually advanced and his pain was controlled with oral pain medication.  His drain Cr was consistent with serum and was removed on POD # 2 and he was discharged home.  Laboratory values:  Recent Labs    06/20/17 1715 06/21/17 0530 06/22/17 0520  HGB 13.9 12.2* 12.5*  HCT 41.2 37.4* 38.1*   Recent Labs    06/21/17 0530 06/22/17 0520  CREATININE 1.37* 1.54*    Disposition: Home  Discharge instruction: The patient was instructed to be ambulatory but told to refrain from heavy lifting, strenuous activity, or driving.   Discharge medications:  Allergies as of 06/22/2017      Reactions   Other Other (See Comments)   Combination of antibiotics and steroids Causes weakness in tendons and muscles      Medication List    STOP taking these medications   ONE-A-DAY MENS PO     TAKE these medications   cetirizine 10 MG tablet Commonly known as:  ZYRTEC Take 10 mg by mouth daily as needed for allergies.   HYDROcodone-acetaminophen 5-325 MG tablet Commonly known as:  NORCO Take 1-2 tablets by mouth every 6 (six) hours as needed for moderate pain or severe pain.   levothyroxine 75 MCG tablet Commonly known as:  SYNTHROID, LEVOTHROID Take 93.75 mcg by mouth daily before breakfast.   lisinopril 5 MG tablet Commonly known as:  PRINIVIL,ZESTRIL Take 5 mg by mouth daily.   metFORMIN 500 MG 24 hr tablet Commonly known as:  GLUCOPHAGE-XR Take  1,000 mg by mouth 2 (two) times daily.       Followup:  Follow-up Information    Raynelle Bring, MD Follow up on 07/19/2017.   Specialty:  Urology Why:  at 10:45 Contact information: [place] [id number] [phone]

## 2017-06-22 NOTE — Progress Notes (Signed)
Patient ID: Shawn Mccarty, male   DOB: 07-Aug-1955, 62 y.o.   MRN: [id number]  2 Days Post-Op Subjective: Pt doing well.  No flatus or BM yet.  Tolerating diet.  Ambulating well.  Pain controlled.  Objective: Vital signs in last 24 hours: Temp:  [98.8 F (37.1 C)-100.4 F (38 C)] 99.1 F (37.3 C) (05/08 0410) Pulse Rate:  [68-106] 93 (05/08 0410) Resp:  [16-18] 18 (05/08 0410) BP: (108-128)/(62-79) 120/79 (05/08 0410) SpO2:  [91 %-98 %] 91 % (05/08 0410)  Intake/Output from previous day: 05/07 0701 - 05/08 0700 In: 480 [P.O.:480] Out: 2216 [Urine:2020; Drains:196] Intake/Output this shift: No intake/output data recorded.  Physical Exam:  General: Alert and oriented Abdomen: Soft, ND, positive BS Incisions: C/D/I Ext: NT, No erythema  Lab Results: Recent Labs    06/20/17 1715 06/21/17 0530 06/22/17 0520  HGB 13.9 12.2* 12.5*  HCT 41.2 37.4* 38.1*   BMET Recent Labs    06/21/17 0530 06/22/17 0520  NA 139 138  K 4.4 4.0  CL 106 104  CO2 25 25  GLUCOSE 157* 145*  BUN 13 14  CREATININE 1.37* 1.54*  CALCIUM 8.5* 8.6*     Studies/Results: Path pending  Drain Cr 1.4  Assessment/Plan: POD # 2 s/p right RAL partial nephrectomy - D/C drain - D/C home   LOS: 0 days   Railey Glad,LES 06/22/2017, 7:37 AM

## 2017-07-19 DIAGNOSIS — C641 Malignant neoplasm of right kidney, except renal pelvis: Secondary | ICD-10-CM | POA: Diagnosis not present

## 2017-10-19 DIAGNOSIS — E039 Hypothyroidism, unspecified: Secondary | ICD-10-CM | POA: Diagnosis not present

## 2017-10-19 DIAGNOSIS — E7800 Pure hypercholesterolemia, unspecified: Secondary | ICD-10-CM | POA: Diagnosis not present

## 2017-10-19 DIAGNOSIS — E119 Type 2 diabetes mellitus without complications: Secondary | ICD-10-CM | POA: Diagnosis not present

## 2017-10-19 DIAGNOSIS — I10 Essential (primary) hypertension: Secondary | ICD-10-CM | POA: Diagnosis not present

## 2017-10-24 DIAGNOSIS — E119 Type 2 diabetes mellitus without complications: Secondary | ICD-10-CM | POA: Diagnosis not present

## 2018-02-17 ENCOUNTER — Ambulatory Visit (HOSPITAL_COMMUNITY)
Admission: RE | Admit: 2018-02-17 | Discharge: 2018-02-17 | Disposition: A | Discharge status: Home / Self Care | Payer: BLUE CROSS/BLUE SHIELD | Source: Ambulatory Visit | Attending: Urology | Admitting: Urology

## 2018-02-17 ENCOUNTER — Other Ambulatory Visit (HOSPITAL_COMMUNITY): Payer: Self-pay | Admitting: Urology

## 2018-02-17 DIAGNOSIS — C641 Malignant neoplasm of right kidney, except renal pelvis: Secondary | ICD-10-CM

## 2018-02-17 DIAGNOSIS — Z8553 Personal history of malignant neoplasm of renal pelvis: Secondary | ICD-10-CM | POA: Diagnosis not present

## 2018-02-22 DIAGNOSIS — C641 Malignant neoplasm of right kidney, except renal pelvis: Secondary | ICD-10-CM | POA: Diagnosis not present

## 2018-02-27 DIAGNOSIS — I10 Essential (primary) hypertension: Secondary | ICD-10-CM | POA: Diagnosis not present

## 2018-02-27 DIAGNOSIS — E039 Hypothyroidism, unspecified: Secondary | ICD-10-CM | POA: Diagnosis not present

## 2018-02-27 DIAGNOSIS — E7800 Pure hypercholesterolemia, unspecified: Secondary | ICD-10-CM | POA: Diagnosis not present

## 2018-02-27 DIAGNOSIS — E119 Type 2 diabetes mellitus without complications: Secondary | ICD-10-CM | POA: Diagnosis not present

## 2018-07-12 DIAGNOSIS — L700 Acne vulgaris: Secondary | ICD-10-CM | POA: Diagnosis not present

## 2018-07-12 DIAGNOSIS — L578 Other skin changes due to chronic exposure to nonionizing radiation: Secondary | ICD-10-CM | POA: Diagnosis not present

## 2018-07-12 DIAGNOSIS — L739 Follicular disorder, unspecified: Secondary | ICD-10-CM | POA: Diagnosis not present

## 2018-07-12 DIAGNOSIS — L821 Other seborrheic keratosis: Secondary | ICD-10-CM | POA: Diagnosis not present

## 2018-07-12 DIAGNOSIS — L570 Actinic keratosis: Secondary | ICD-10-CM | POA: Diagnosis not present

## 2018-08-16 DIAGNOSIS — C641 Malignant neoplasm of right kidney, except renal pelvis: Secondary | ICD-10-CM | POA: Diagnosis not present

## 2018-08-23 DIAGNOSIS — C641 Malignant neoplasm of right kidney, except renal pelvis: Secondary | ICD-10-CM | POA: Diagnosis not present

## 2018-08-23 DIAGNOSIS — C649 Malignant neoplasm of unspecified kidney, except renal pelvis: Secondary | ICD-10-CM | POA: Diagnosis not present

## 2018-08-23 DIAGNOSIS — D3502 Benign neoplasm of left adrenal gland: Secondary | ICD-10-CM | POA: Diagnosis not present

## 2018-08-28 DIAGNOSIS — I10 Essential (primary) hypertension: Secondary | ICD-10-CM | POA: Diagnosis not present

## 2018-08-28 DIAGNOSIS — E039 Hypothyroidism, unspecified: Secondary | ICD-10-CM | POA: Diagnosis not present

## 2018-08-28 DIAGNOSIS — E119 Type 2 diabetes mellitus without complications: Secondary | ICD-10-CM | POA: Diagnosis not present

## 2018-08-28 DIAGNOSIS — E7800 Pure hypercholesterolemia, unspecified: Secondary | ICD-10-CM | POA: Diagnosis not present

## 2018-08-30 DIAGNOSIS — Z85528 Personal history of other malignant neoplasm of kidney: Secondary | ICD-10-CM | POA: Diagnosis not present

## 2018-11-30 DIAGNOSIS — E119 Type 2 diabetes mellitus without complications: Secondary | ICD-10-CM | POA: Diagnosis not present

## 2018-12-20 DIAGNOSIS — R972 Elevated prostate specific antigen [PSA]: Secondary | ICD-10-CM | POA: Diagnosis not present

## 2018-12-20 DIAGNOSIS — Z0000 Encounter for general adult medical examination without abnormal findings: Secondary | ICD-10-CM | POA: Diagnosis not present

## 2019-01-16 DIAGNOSIS — J3489 Other specified disorders of nose and nasal sinuses: Secondary | ICD-10-CM | POA: Diagnosis not present

## 2019-01-16 DIAGNOSIS — Z20828 Contact with and (suspected) exposure to other viral communicable diseases: Secondary | ICD-10-CM | POA: Diagnosis not present

## 2019-02-28 DIAGNOSIS — E7800 Pure hypercholesterolemia, unspecified: Secondary | ICD-10-CM | POA: Diagnosis not present

## 2019-02-28 DIAGNOSIS — E119 Type 2 diabetes mellitus without complications: Secondary | ICD-10-CM | POA: Diagnosis not present

## 2019-02-28 DIAGNOSIS — E039 Hypothyroidism, unspecified: Secondary | ICD-10-CM | POA: Diagnosis not present

## 2019-02-28 DIAGNOSIS — I10 Essential (primary) hypertension: Secondary | ICD-10-CM | POA: Diagnosis not present

## 2019-03-02 ENCOUNTER — Other Ambulatory Visit (HOSPITAL_COMMUNITY): Payer: Self-pay | Admitting: Urology

## 2019-03-02 ENCOUNTER — Other Ambulatory Visit: Payer: Self-pay

## 2019-03-02 ENCOUNTER — Ambulatory Visit (HOSPITAL_COMMUNITY)
Admission: RE | Admit: 2019-03-02 | Discharge: 2019-03-02 | Disposition: A | Discharge status: Home / Self Care | Payer: BC Managed Care – PPO | Source: Ambulatory Visit | Attending: Urology | Admitting: Urology

## 2019-03-02 DIAGNOSIS — Z85528 Personal history of other malignant neoplasm of kidney: Secondary | ICD-10-CM | POA: Diagnosis not present

## 2019-03-02 DIAGNOSIS — C641 Malignant neoplasm of right kidney, except renal pelvis: Secondary | ICD-10-CM | POA: Diagnosis not present

## 2019-03-09 DIAGNOSIS — R3912 Poor urinary stream: Secondary | ICD-10-CM | POA: Diagnosis not present

## 2019-03-09 DIAGNOSIS — N5201 Erectile dysfunction due to arterial insufficiency: Secondary | ICD-10-CM | POA: Diagnosis not present

## 2019-03-09 DIAGNOSIS — Z85528 Personal history of other malignant neoplasm of kidney: Secondary | ICD-10-CM | POA: Diagnosis not present

## 2019-03-09 DIAGNOSIS — N401 Enlarged prostate with lower urinary tract symptoms: Secondary | ICD-10-CM | POA: Diagnosis not present

## 2019-05-30 DIAGNOSIS — I10 Essential (primary) hypertension: Secondary | ICD-10-CM | POA: Diagnosis not present

## 2019-05-30 DIAGNOSIS — E119 Type 2 diabetes mellitus without complications: Secondary | ICD-10-CM | POA: Diagnosis not present

## 2019-05-30 DIAGNOSIS — E039 Hypothyroidism, unspecified: Secondary | ICD-10-CM | POA: Diagnosis not present

## 2019-05-30 DIAGNOSIS — E7800 Pure hypercholesterolemia, unspecified: Secondary | ICD-10-CM | POA: Diagnosis not present

## 2019-07-12 DIAGNOSIS — D2239 Melanocytic nevi of other parts of face: Secondary | ICD-10-CM | POA: Diagnosis not present

## 2019-07-12 DIAGNOSIS — L570 Actinic keratosis: Secondary | ICD-10-CM | POA: Diagnosis not present

## 2019-07-12 DIAGNOSIS — L821 Other seborrheic keratosis: Secondary | ICD-10-CM | POA: Diagnosis not present

## 2019-07-12 DIAGNOSIS — L578 Other skin changes due to chronic exposure to nonionizing radiation: Secondary | ICD-10-CM | POA: Diagnosis not present

## 2019-09-02 IMAGING — CT CT ABD-PELV W/ CM
2 of 5 series · 15 of 46 positions shown, 17 images · IV contrast (APPLIED)
Comparison: None.

CLINICAL DATA: Left-sided abdominal pain for several weeks

EXAM:
CT ABDOMEN AND PELVIS WITH CONTRAST
TECHNIQUE: Multidetector CT imaging of the abdomen and pelvis was performed
using the standard protocol following bolus administration of
intravenous contrast.
CONTRAST:  100mL [id number]

[Series 2: axial st · axial · 0.92mm/px · z∈[-496,-56]mm · 12 of 106 slices shown, 14 images]
[im 9/106  soft-tissue]
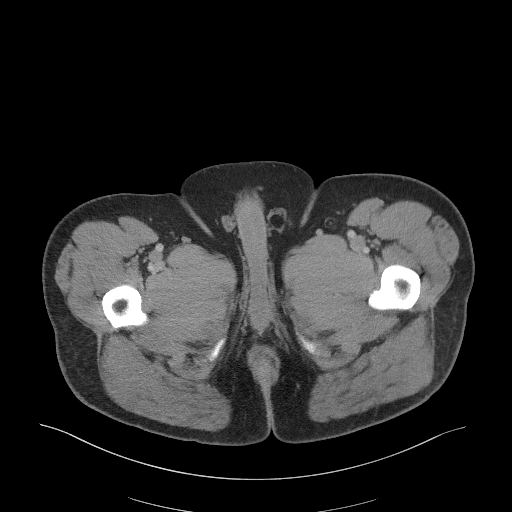
[im 9/106  bone]
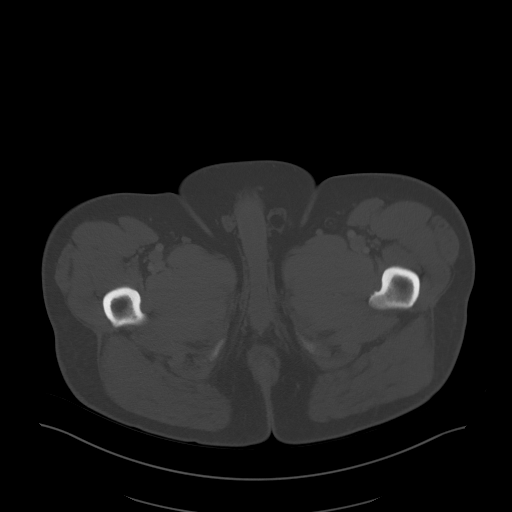
[im 17/106  soft-tissue]
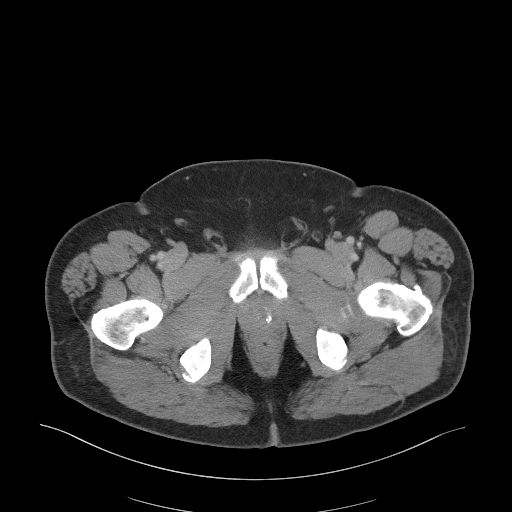
[im 25/106  soft-tissue]
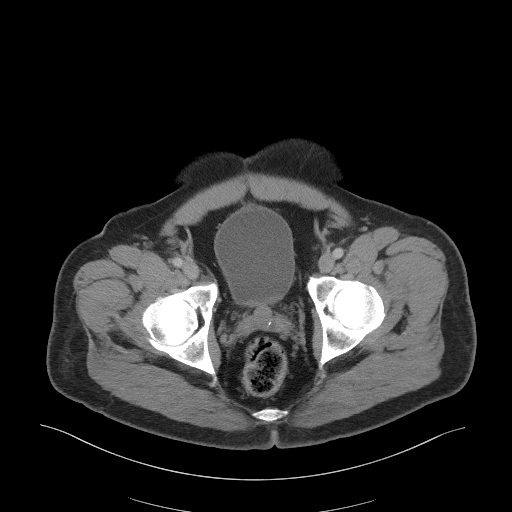
[im 33/106  soft-tissue]
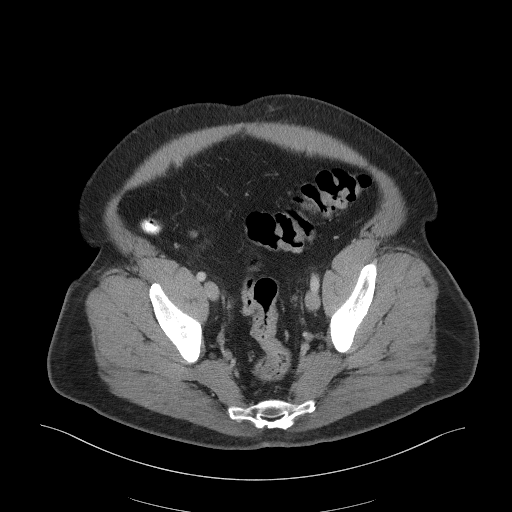
[im 41/106  soft-tissue]
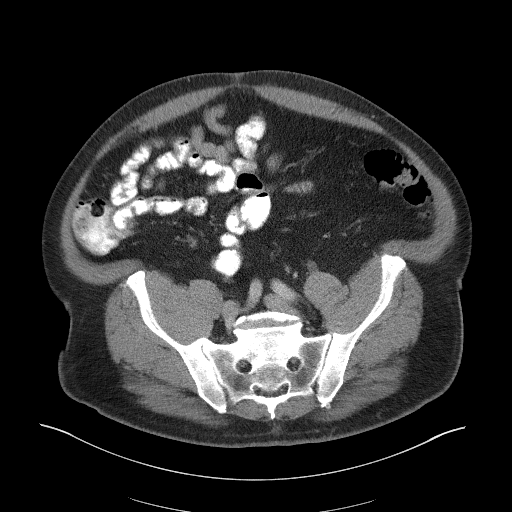
[im 49/106  soft-tissue]
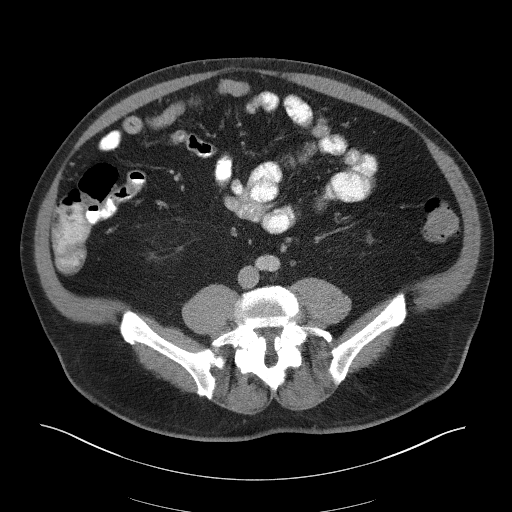
[im 57/106  soft-tissue]
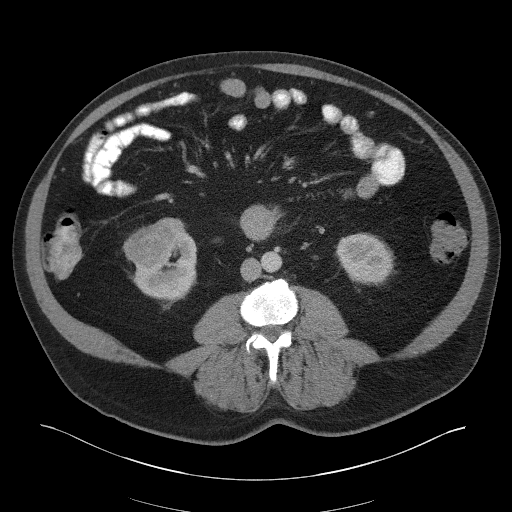
[im 65/106  soft-tissue]
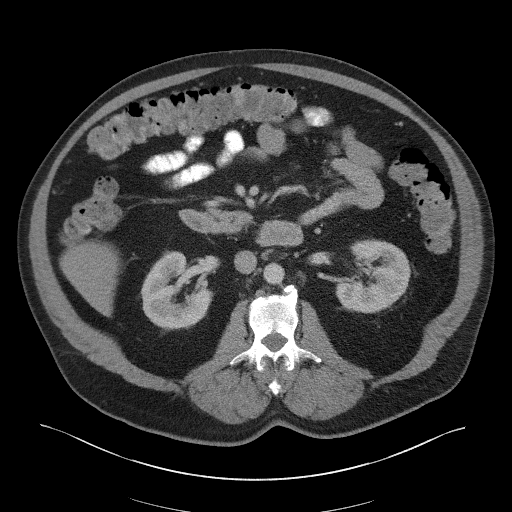
[im 73/106  soft-tissue]
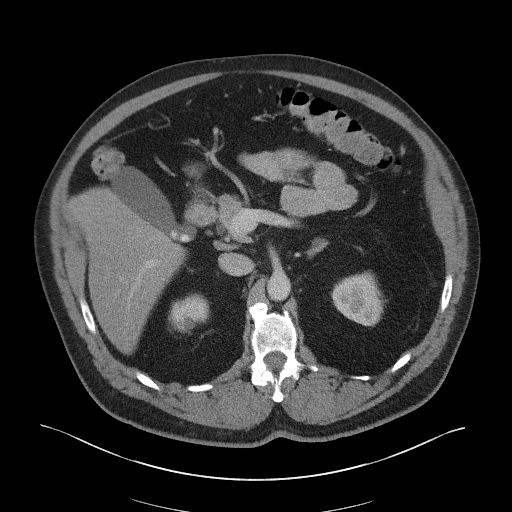
[im 73/106  bone]
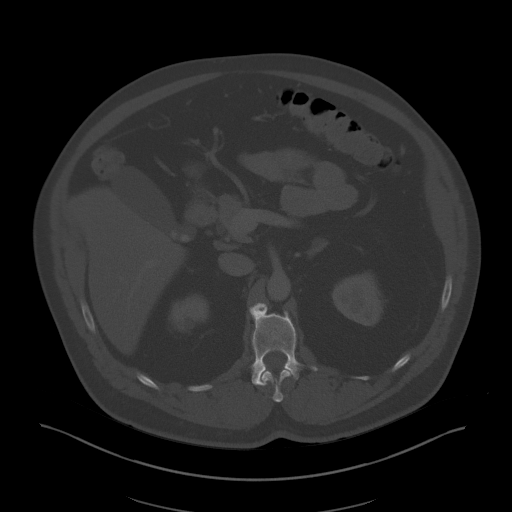
[im 81/106  soft-tissue]
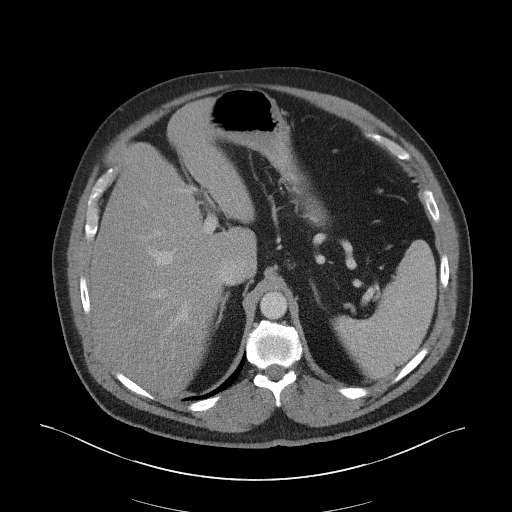
[im 89/106  soft-tissue]
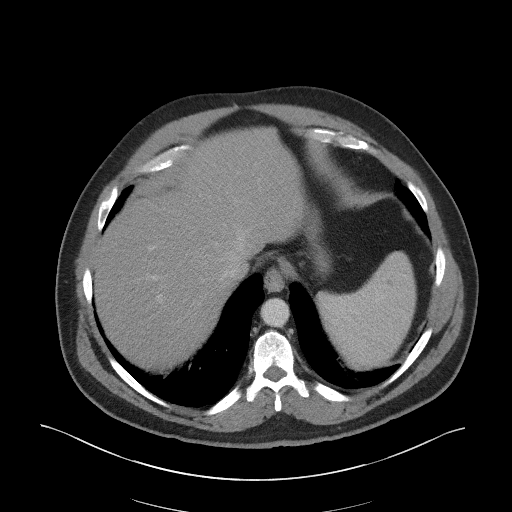
[im 97/106  soft-tissue]
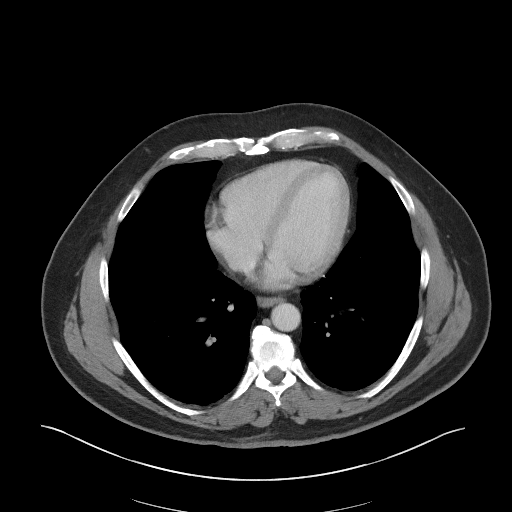

[Series 5: coronal st · coronal · 0.92mm/px · 3 of 127 slices shown]
[im 43/127  soft-tissue]
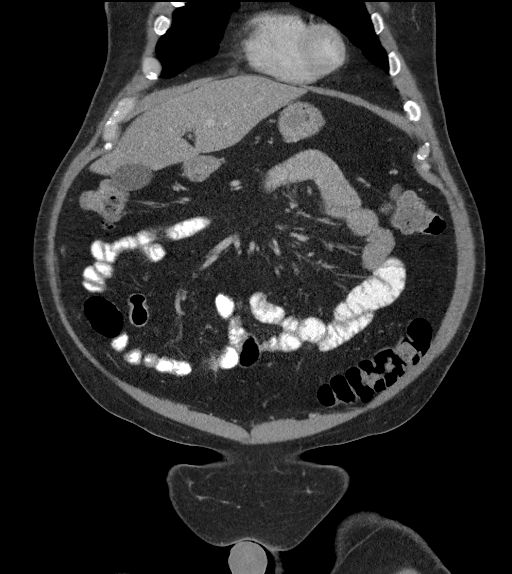
[im 57/127  soft-tissue]
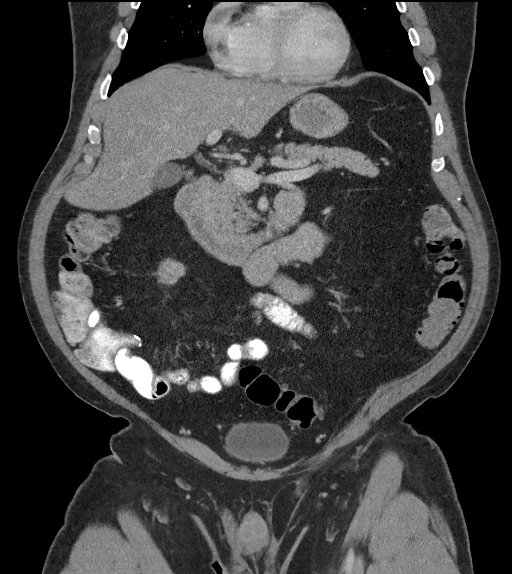
[im 71/127  soft-tissue]
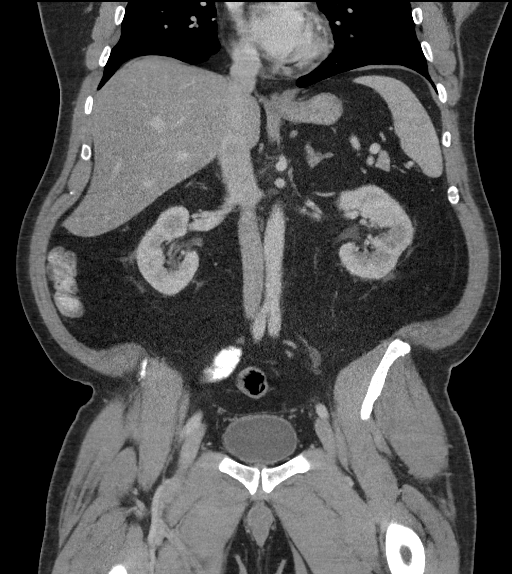

[15 of 46 positions shown; findings below may reference images not displayed]

FINDINGS: Lower chest: Mild bibasilar atelectasis.

Hepatobiliary: Liver is diffusely fatty infiltrated. Dependent
gallstones are noted within the gallbladder. No complicating factors
are seen.

Pancreas: Unremarkable. No pancreatic ductal dilatation or
surrounding inflammatory changes.

Spleen: Normal in size without focal abnormality.

Adrenals/Urinary Tract: Adrenal glands are well visualized
bilaterally. A 16 mm small nodule is noted along the inferior aspect
of the left adrenal most consistent with an adenoma. Kidneys are
well visualized bilaterally. No renal calculi or obstructive changes
are seen. The right kidney demonstrates a somewhat bilobed enhancing
lesion which measures 5.6 x 3.7 x 5.3 cm. Left kidney is within
normal limits. Bladder is unremarkable.

Stomach/Bowel: No significant diverticular change of the colon is
seen. The appendix has been surgically removed. No obstructive
changes are noted.

Vascular/Lymphatic: Aortic atherosclerosis. No enlarged abdominal or
pelvic lymph nodes.

Reproductive: Prostate is unremarkable.

Other: No abdominal wall hernia or abnormality. No abdominopelvic
ascites.

Musculoskeletal: Degenerative changes of the lumbar spine are seen.
IMPRESSION: Enhancing mass lesion within the right kidney as described. This is
consistent with a renal cell carcinoma till proven otherwise.
Further workup is recommended.

Small nodule within the left adrenal gland likely representing an
adenoma.

Cholelithiasis without complicating factors.

Fatty liver.

These results will be called to the ordering clinician or
representative by the Radiologist Assistant, and communication
documented in the PACS or zVision Dashboard.

## 2019-09-12 DIAGNOSIS — C641 Malignant neoplasm of right kidney, except renal pelvis: Secondary | ICD-10-CM | POA: Diagnosis not present

## 2019-09-12 DIAGNOSIS — J984 Other disorders of lung: Secondary | ICD-10-CM | POA: Diagnosis not present

## 2019-09-12 DIAGNOSIS — M4319 Spondylolisthesis, multiple sites in spine: Secondary | ICD-10-CM | POA: Diagnosis not present

## 2019-09-12 DIAGNOSIS — Z85528 Personal history of other malignant neoplasm of kidney: Secondary | ICD-10-CM | POA: Diagnosis not present

## 2019-09-12 DIAGNOSIS — I700 Atherosclerosis of aorta: Secondary | ICD-10-CM | POA: Diagnosis not present

## 2019-09-12 DIAGNOSIS — K8020 Calculus of gallbladder without cholecystitis without obstruction: Secondary | ICD-10-CM | POA: Diagnosis not present

## 2019-09-21 DIAGNOSIS — N401 Enlarged prostate with lower urinary tract symptoms: Secondary | ICD-10-CM | POA: Diagnosis not present

## 2019-09-21 DIAGNOSIS — Z85528 Personal history of other malignant neoplasm of kidney: Secondary | ICD-10-CM | POA: Diagnosis not present

## 2019-09-21 DIAGNOSIS — N5201 Erectile dysfunction due to arterial insufficiency: Secondary | ICD-10-CM | POA: Diagnosis not present

## 2019-09-21 DIAGNOSIS — R3912 Poor urinary stream: Secondary | ICD-10-CM | POA: Diagnosis not present

## 2019-12-05 DIAGNOSIS — I10 Essential (primary) hypertension: Secondary | ICD-10-CM | POA: Diagnosis not present

## 2019-12-05 DIAGNOSIS — E039 Hypothyroidism, unspecified: Secondary | ICD-10-CM | POA: Diagnosis not present

## 2019-12-05 DIAGNOSIS — E119 Type 2 diabetes mellitus without complications: Secondary | ICD-10-CM | POA: Diagnosis not present

## 2019-12-05 DIAGNOSIS — E559 Vitamin D deficiency, unspecified: Secondary | ICD-10-CM | POA: Diagnosis not present

## 2019-12-19 DIAGNOSIS — E119 Type 2 diabetes mellitus without complications: Secondary | ICD-10-CM | POA: Diagnosis not present

## 2020-06-22 IMAGING — CR DG CHEST 2V
2 series · 2 of 2 positions shown · non-contrast
Comparison: 05/11/2017

CLINICAL DATA: History of renal cell carcinoma with prior removal
in Thursday June, 2017

EXAM:
CHEST - 2 VIEW

[w chest pa]
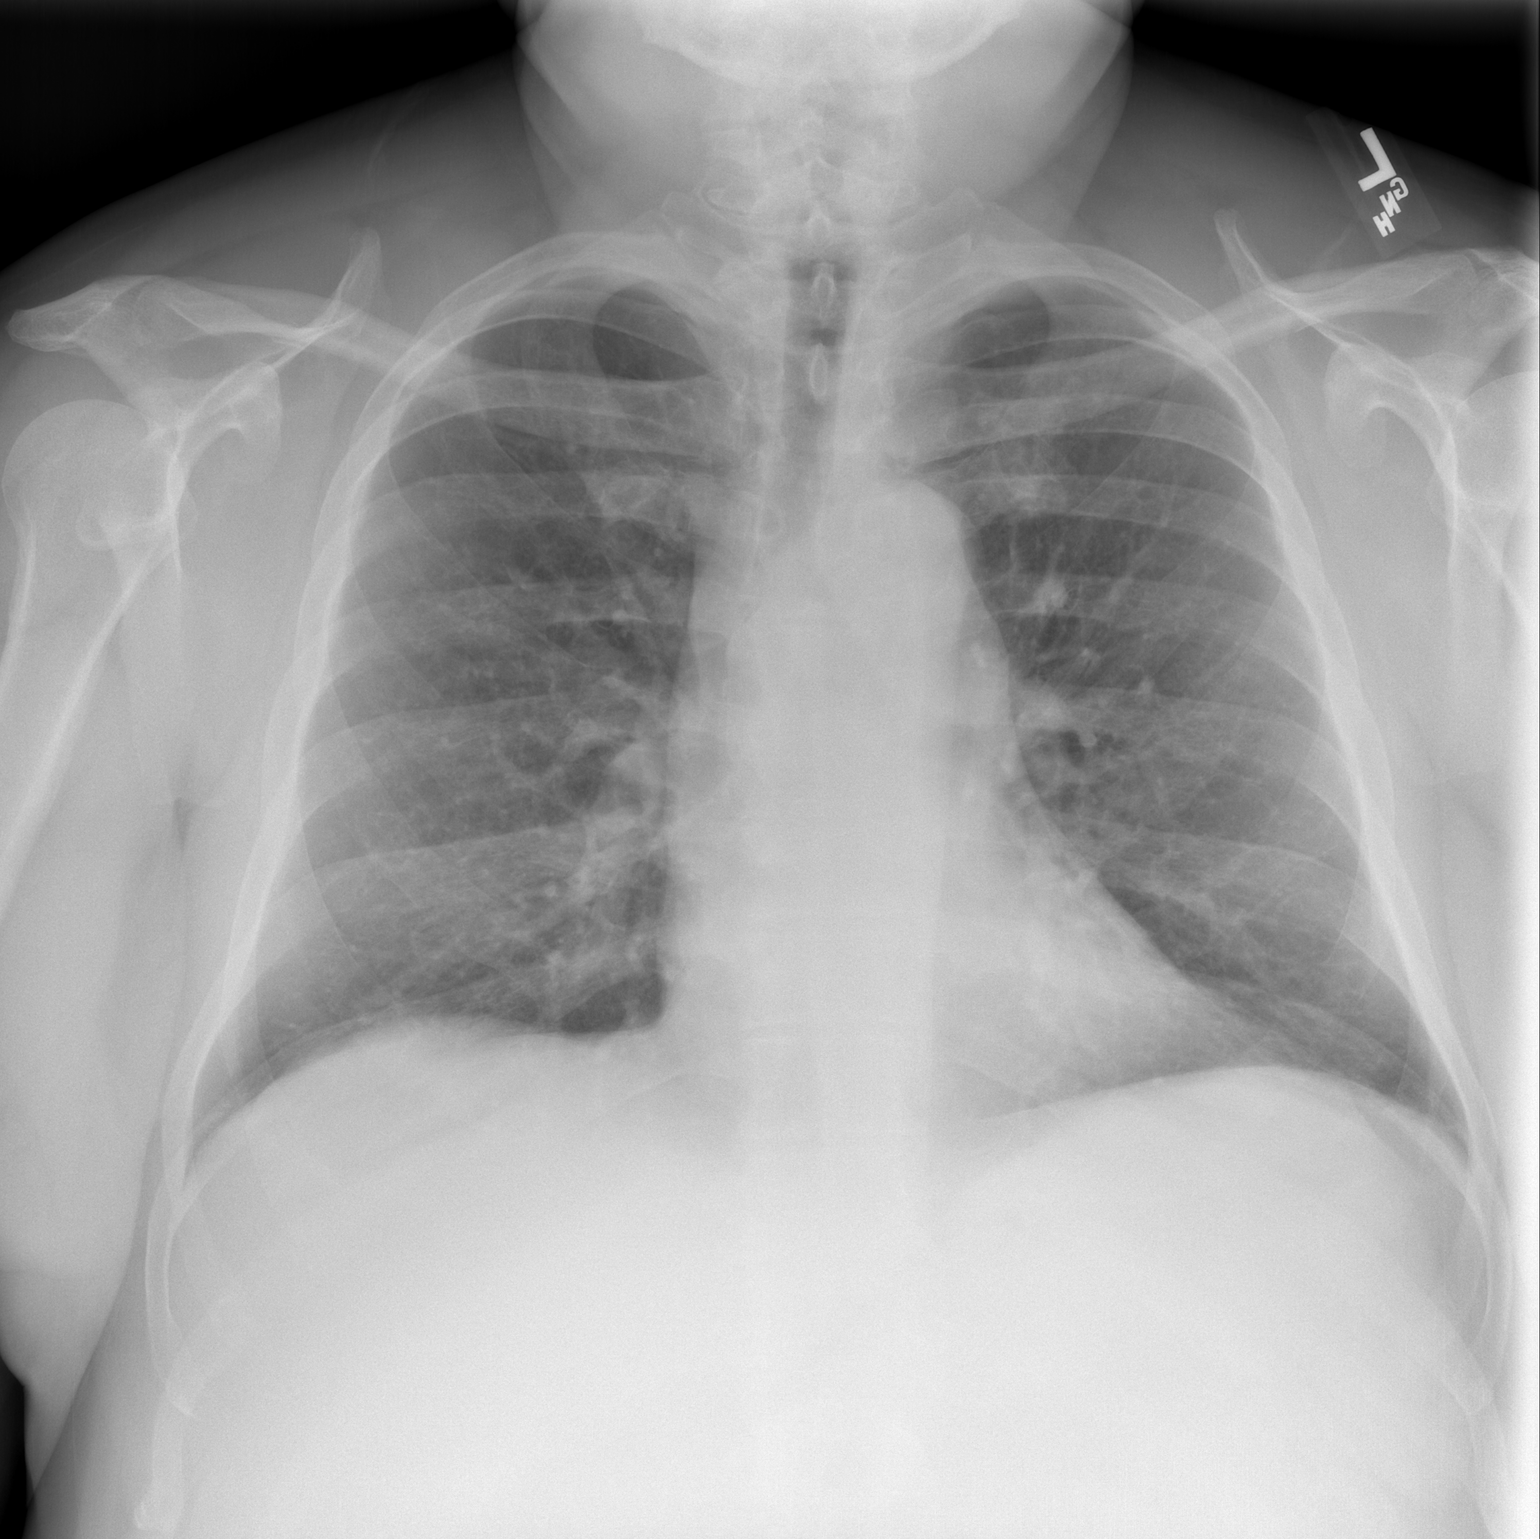

[w chest lat]
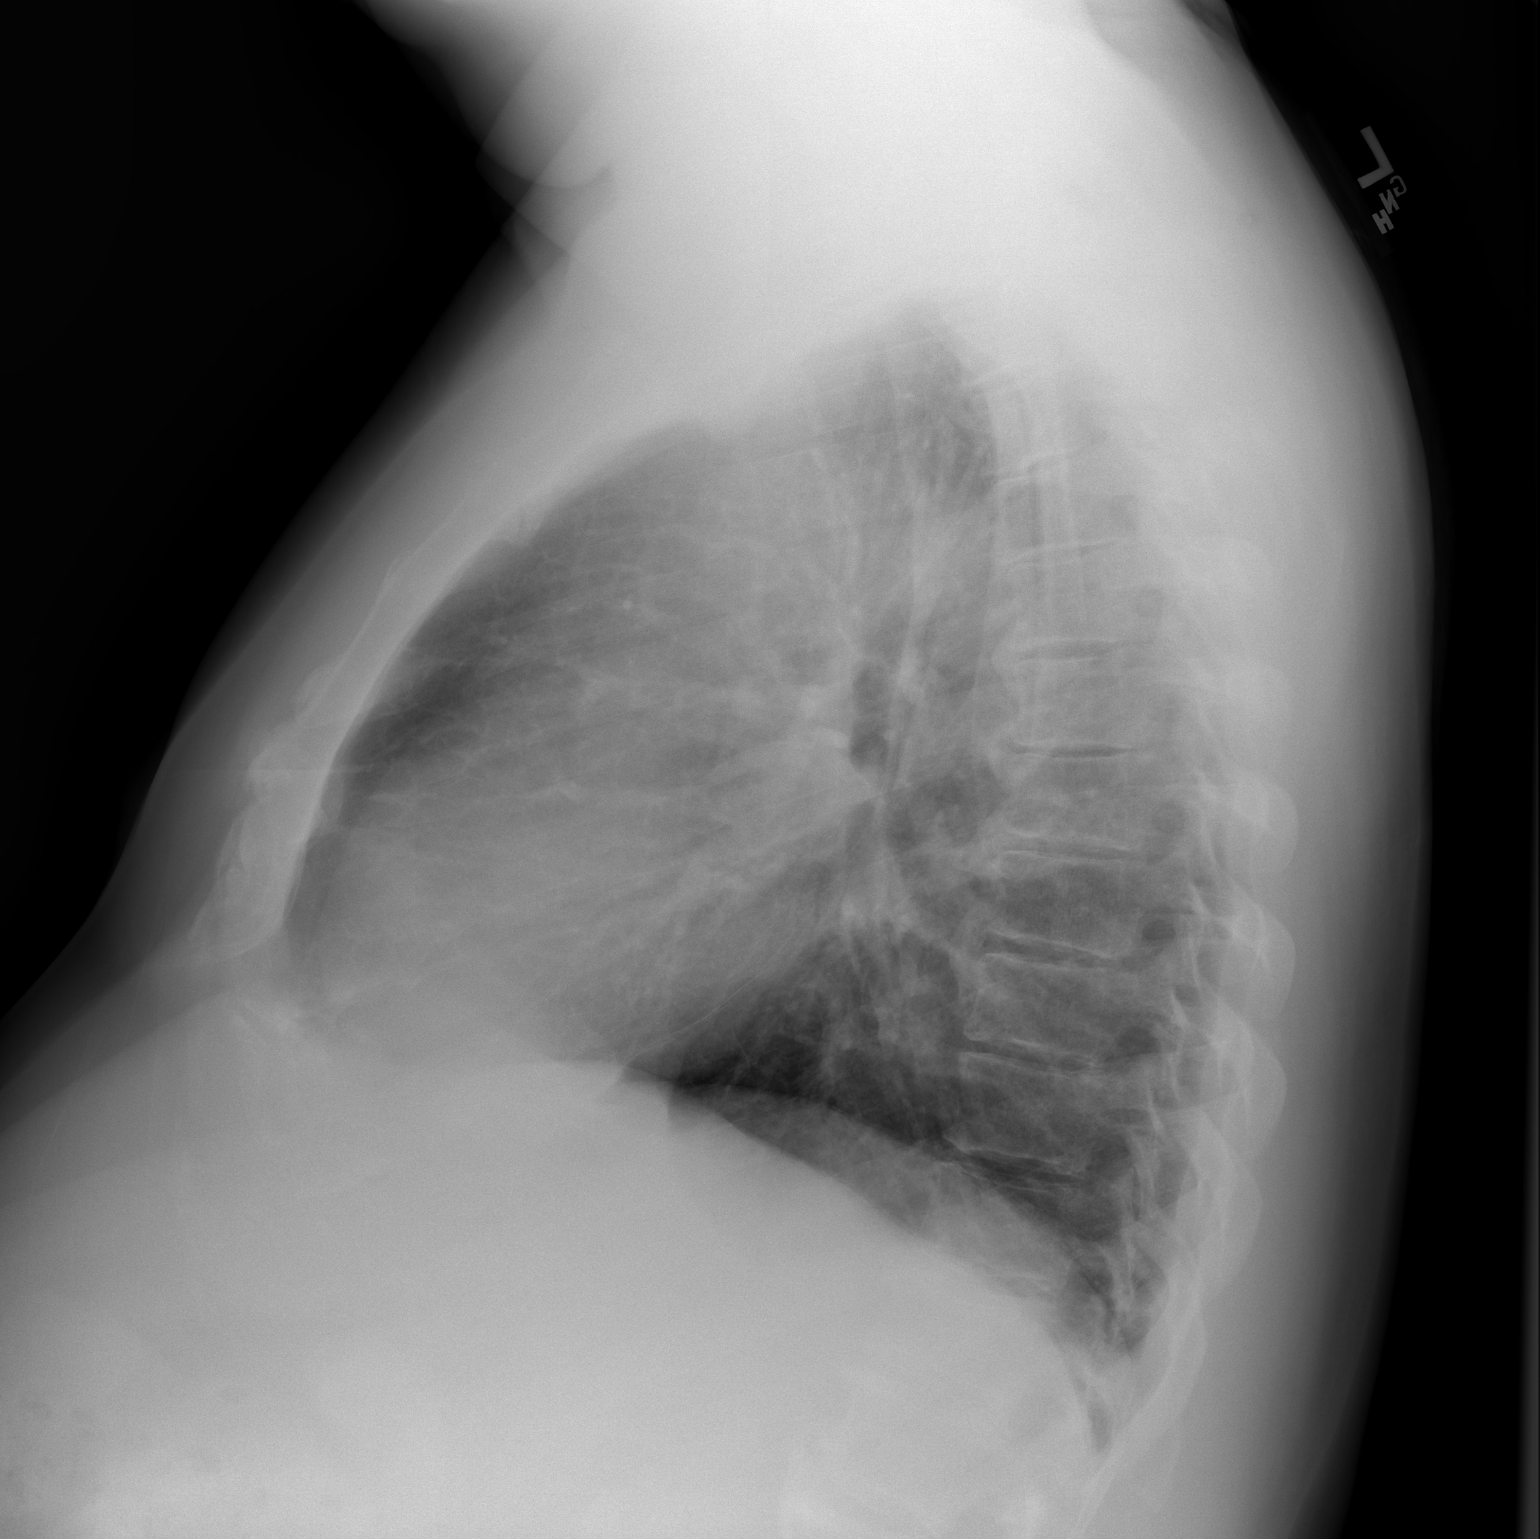

[2 of 2 positions shown; findings below may reference images not displayed]

FINDINGS: The heart size and mediastinal contours are within normal limits.
Both lungs are clear. The visualized skeletal structures are
unremarkable.
IMPRESSION: No active cardiopulmonary disease.

## 2020-12-26 ENCOUNTER — Other Ambulatory Visit: Payer: Self-pay | Admitting: Surgery

## 2021-09-22 ENCOUNTER — Other Ambulatory Visit: Payer: Self-pay | Admitting: Family Medicine

## 2021-09-22 DIAGNOSIS — R42 Dizziness and giddiness: Secondary | ICD-10-CM

## 2021-10-05 ENCOUNTER — Ambulatory Visit
Admission: RE | Admit: 2021-10-05 | Discharge: 2021-10-05 | Disposition: A | Discharge status: Home / Self Care | Payer: 59 | Source: Ambulatory Visit | Attending: Family Medicine | Admitting: Family Medicine

## 2021-10-05 DIAGNOSIS — R42 Dizziness and giddiness: Secondary | ICD-10-CM

## 2021-10-05 MED ORDER — GADOBENATE DIMEGLUMINE 529 MG/ML IV SOLN: 20.0000 mL | Freq: Once | INTRAVENOUS | Status: DC | PRN

## 2023-11-29 ENCOUNTER — Other Ambulatory Visit: Payer: Self-pay | Admitting: Family Medicine

## 2023-11-29 DIAGNOSIS — I2510 Atherosclerotic heart disease of native coronary artery without angina pectoris: Secondary | ICD-10-CM

## 2023-11-29 NOTE — Progress Notes (Signed)
 Mod ASCVD risk.  CAC screening exam.

## 2023-11-29 NOTE — Addendum Note (Signed)
 Addended by: REMONIA, L[place] Constantin J on: 11/29/2023 01:12 PM   Modules accepted: Orders

## 2023-12-20 ENCOUNTER — Ambulatory Visit: Attending: Cardiology

## 2023-12-20 DIAGNOSIS — I2510 Atherosclerotic heart disease of native coronary artery without angina pectoris: Secondary | ICD-10-CM

## 2023-12-20 LAB — ECHOCARDIOGRAM COMPLETE
Area-P 1/2: 3.54 cm2
S' Lateral: 2.2 cm

## 2023-12-21 ENCOUNTER — Ambulatory Visit (HOSPITAL_BASED_OUTPATIENT_CLINIC_OR_DEPARTMENT_OTHER)
Admission: RE | Admit: 2023-12-21 | Discharge: 2023-12-21 | Disposition: A | Discharge status: Home / Self Care | Payer: Self-pay | Source: Ambulatory Visit | Attending: Family Medicine | Admitting: Family Medicine

## 2023-12-21 DIAGNOSIS — I2510 Atherosclerotic heart disease of native coronary artery without angina pectoris: Secondary | ICD-10-CM
